# Patient Record
Sex: Female | Born: 1935 | Race: White | Hispanic: No | Marital: Married | Smoking: Never smoker
Health system: Southern US, Community
[De-identification: ages and names within clinical notes are randomized; demographics above are authoritative.]

## PROBLEM LIST (undated history)

## (undated) DIAGNOSIS — I10 Essential (primary) hypertension: Secondary | ICD-10-CM

## (undated) DIAGNOSIS — M1990 Unspecified osteoarthritis, unspecified site: Secondary | ICD-10-CM

## (undated) DIAGNOSIS — I499 Cardiac arrhythmia, unspecified: Secondary | ICD-10-CM

## (undated) DIAGNOSIS — R55 Syncope and collapse: Secondary | ICD-10-CM

## (undated) DIAGNOSIS — I447 Left bundle-branch block, unspecified: Secondary | ICD-10-CM

## (undated) DIAGNOSIS — J45909 Unspecified asthma, uncomplicated: Secondary | ICD-10-CM

---
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
## 2000-05-21 ENCOUNTER — Encounter: Payer: Self-pay | Admitting: Emergency Medicine

## 2000-05-21 ENCOUNTER — Emergency Department (HOSPITAL_COMMUNITY): Admission: EM | Admit: 2000-05-21 | Discharge: 2000-05-21 | Payer: Self-pay | Admitting: Emergency Medicine

## 2002-01-14 ENCOUNTER — Other Ambulatory Visit: Admission: RE | Admit: 2002-01-14 | Discharge: 2002-01-14 | Payer: Self-pay | Admitting: Family Medicine

## 2004-02-08 ENCOUNTER — Other Ambulatory Visit: Admission: RE | Admit: 2004-02-08 | Discharge: 2004-02-08 | Payer: Self-pay | Admitting: Family Medicine

## 2004-03-20 ENCOUNTER — Encounter: Admission: RE | Admit: 2004-03-20 | Discharge: 2004-03-20 | Payer: Self-pay | Admitting: Family Medicine

## 2007-03-05 ENCOUNTER — Ambulatory Visit: Payer: Self-pay | Admitting: Cardiology

## 2007-03-05 ENCOUNTER — Inpatient Hospital Stay (HOSPITAL_COMMUNITY): Admission: EM | Admit: 2007-03-05 | Discharge: 2007-03-07 | Payer: Self-pay | Admitting: Emergency Medicine

## 2007-03-12 ENCOUNTER — Ambulatory Visit: Payer: Self-pay | Admitting: Internal Medicine

## 2007-03-12 ENCOUNTER — Ambulatory Visit: Payer: Self-pay

## 2007-03-12 LAB — CONVERTED CEMR LAB
BUN: 10 mg/dL (ref 6–23)
CO2: 29 meq/L (ref 19–32)
Calcium: 9.7 mg/dL (ref 8.4–10.5)
Chloride: 101 meq/L (ref 96–112)
Creatinine, Ser: 0.7 mg/dL (ref 0.4–1.2)
GFR calc Af Amer: 105 mL/min
GFR calc non Af Amer: 87 mL/min
Glucose, Bld: 104 mg/dL — ABNORMAL HIGH (ref 70–99)
Potassium: 4.2 meq/L (ref 3.5–5.1)
Sodium: 139 meq/L (ref 135–145)

## 2007-04-02 ENCOUNTER — Ambulatory Visit: Payer: Self-pay | Admitting: Internal Medicine

## 2007-04-02 LAB — CONVERTED CEMR LAB
BUN: 9 mg/dL (ref 6–23)
CO2: 29 meq/L (ref 19–32)
Calcium: 9.6 mg/dL (ref 8.4–10.5)
Chloride: 106 meq/L (ref 96–112)
Creatinine, Ser: 0.6 mg/dL (ref 0.4–1.2)
GFR calc Af Amer: 126 mL/min
GFR calc non Af Amer: 104 mL/min
Glucose, Bld: 101 mg/dL — ABNORMAL HIGH (ref 70–99)
Potassium: 4.3 meq/L (ref 3.5–5.1)
Sodium: 140 meq/L (ref 135–145)

## 2007-12-08 ENCOUNTER — Ambulatory Visit: Payer: Self-pay | Admitting: Internal Medicine

## 2007-12-23 ENCOUNTER — Encounter: Payer: Self-pay | Admitting: Internal Medicine

## 2007-12-23 ENCOUNTER — Ambulatory Visit: Payer: Self-pay

## 2008-01-26 ENCOUNTER — Ambulatory Visit: Payer: Self-pay | Admitting: Internal Medicine

## 2009-02-10 IMAGING — CR DG CHEST 1V PORT
1 series · 1 of 1 positions shown · non-contrast
Comparison: none

CLINICAL DATA: 76-year-old with chest pain and cough.  
PORTABLE CHEST - 1 VIEW 03/05/07:

[AP]
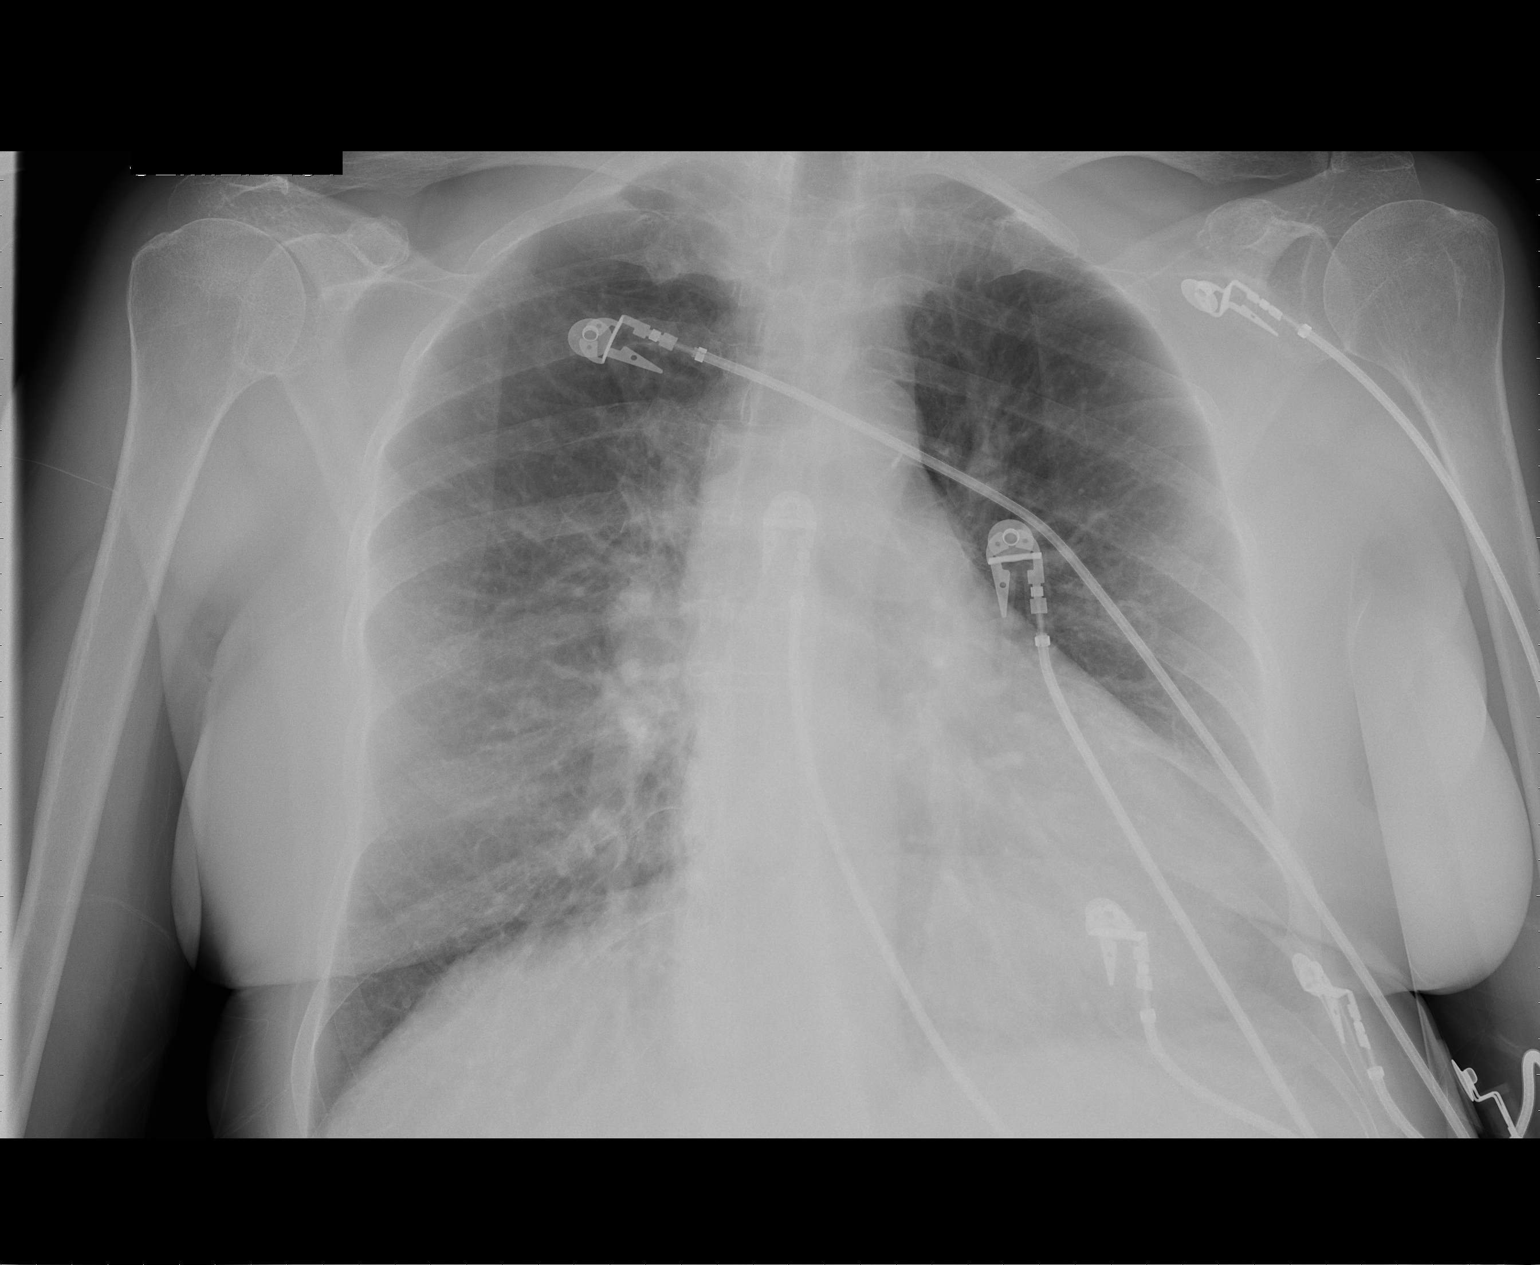

[1 of 1 positions shown; findings below may reference images not displayed]

FINDINGS: Portable view of the chest demonstrates a coarse interstitial lung pattern.  Interstitial lung pattern may represent chronic changes and there is no definite pulmonary edema.  Heart size is upper limits of normal.  The trachea is midline.  The bony structures are intact.
IMPRESSION: 1.  Cardiomegaly. 
2.  Coarse interstitial lung pattern is likely chronic in nature.  No definite pulmonary edema.

## 2010-07-11 NOTE — Letter (Signed)
December 08, 2007    [place]  [address]  [place], [place] [address]   RE:  Deanna Diaz, Deanna Diaz  MRN:  [id number]  /  DOB:  1930/07/26   Dear Michele Mcalpine,   Ms. Sheen comes in today in followup for tachycardia and cardiomyopathy.  She also has significant and unfortunately poorly-controlled blood  pressure.  Today, she has had no complaints of chest pain or shortness  of breath and no recurrent tachy palpitations.   Her medications include metoprolol 25 b.i.d. changed from 50 succinate  during the short age as well as lisinopril 20.   On examination, her blood pressure was 168/80.  Her pulse was 59.  Her  lungs were clear.  Her heart sounds were regular.  The abdomen was soft.  The extremities were without edema, and her skin was warm and dry.   IMPRESSION:  1. Supraventricular tachycardia.  2. Cardiomyopathy, question cause.  3. Hypertension - poorly controlled.  4. Modest bradycardia.   PLAN:  1. We will plan to fill today to change her lisinopril from 20 to      lisinopril/hydrochlorothiazide 20/12.5 with the hopes that you will      further up-titrate it to 40/25 as needed.  2. I will change her metoprolol to carvedilol 12.5 b.i.d.  Further up-      titration of it will also be      worth pursuing in the event that we can without aggravating her      bradycardia.  We will plan to get an outpatient echo to assess the      impact on her left ventricular systolic function.   I will see her again in 6 months' time.    Sincerely,      Duke Salvia, MD, [facility]  Electronically Signed    SCK/MedQ  DD: 12/08/2007  DT: 12/09/2007  Job #: [id number]

## 2010-07-11 NOTE — Discharge Summary (Signed)
NAME:  Deanna Diaz, Deanna Diaz NO.:  [id number]   MEDICAL RECORD NO.:  [id number]          PATIENT TYPE:  INP   LOCATION:  2028                         FACILITY:  [facility]   PHYSICIAN:  Gerrit Friends. Dietrich Pates, MD, FACCDATE OF BIRTH:  01-15-1931   DATE OF ADMISSION:  03/05/2007  DATE OF DISCHARGE:  03/07/2007                               DISCHARGE SUMMARY   PRIMARY CARDIOLOGIST:  Dr. Sherryl Manges.   PRIMARY CARE [place]:  Dr. Lianne Bushy   DISCHARGE DIAGNOSIS:  Paroxysmal supraventricular tachycardia.   SECONDARY DIAGNOSES:  1. Left bundle branch block.  2. Mild troponin elevation.  3. Cardiomyopathy, question ischemia verses nonischemic with stress      test pending.  4. Mild to moderate mitral regurgitation.   ALLERGIES:  NO KNOWN DRUG ALLERGIES.   PROCEDURES:  A 2-D echocardiogram revealing an EF of 30-35% with  hypokinesis of the anteroseptal and periapical wall.  Mild to moderate  mitral regurgitation.   HISTORY OF PRESENT ILLNESS:  This is a 77 year old female with  questionable prior history of mild stroke in 2007.  The patient had  never been formally evaluated for that.  She had no residual weakness.  She was in her usual state of health until approximately 6:30 p.m. on  March 05, 2007 when she had sudden onset of dizziness and  lightheadedness.  She was taken to the [facility] emergency room where  was found to be in SVT with rates in the 130s.  She was treated with 6  mg of intravenous adenosine with conversion to sinus rhythm with a left  bundle branch block, which is known to be old dating back to the summer  of 2008.  She was admitted for further evaluation.   HOSPITAL COURSE:  Ms. Stroh had mild elevation of troponin to a peak of  0.11 with normal CKs and MBs.  In sinus rhythm, she had no recurrent  chest discomfort.  Her BNP was also mildly elevated at 456.0.  A 2-D  echocardiogram was performed on March 06, 2007 revealing an EF of 30-  35% with  full results as outlined above.  She was evaluated by Dr.  Sherryl Manges from Electrophysiology with regards to her SVT and he  recommended continuation of beta blocker therapy in the setting of low  EF, addition of ACE inhibitor therapy.  We have arranged for outpatient  cardiac event monitoring as well as an outpatient Myoview.  She will  followup for a BMET in one week and followup with Dr. Graciela Husbands in  approximately one month.   DISCHARGE LABS:  Hemoglobin 13.6, hematocrit 39.9, WBC 10.2, platelets  270, MCV 81.3.  Sodium 135, potassium 4.2, chloride 105, CO2 26, BUN 7,  creatinine 0.48, glucose 116.  INR 1.0.  Total bilirubin 0.6, alkaline  phosphatase 103, AST 21, ALT 13, albumin 3.8.  CK 74, MB 3.9, troponin I  0.10.  Total cholesterol 181, triglycerides 64, HDL 40, LDL 128.  Calcium 8.8 and magnesium 1.8.  Urinalysis was negative.  Hemoglobin A1c  was 5.4.  D-dimer was 0.37.  BNP  was 456.0.  TSH was 0.397.   DISPOSITION:  The patient is being discharged home today in good  condition.   FOLLOW-UP PLANS AND APPOINTMENTS:  She will have a follow-up BMET on  March 14, 2007 at 9 a.m.  She will have a pharmacologic stress test on  March 12, 2007 at 9:30 a.m. at our [place] office.  We will also  arranged for her to have a cardiac event monitor.  She will followup  with Dr. Graciela Husbands on April 02, 2007 at 9:30 a.m.   DISCHARGE MEDICATIONS:  1. Toprol XL 50 mg daily.  2. Lisinopril 20 mg daily.  3. Aspirin 81 mg daily.   PENDING LAB STUDIES:  None.   DURATION OF DISCHARGE ENCOUNTER:  Forty-five minutes including physician  time.      Nicolasa Ducking, ANP      Gerrit Friends. Dietrich Pates, MD, [facility]  Electronically Signed    CB/MEDQ  D:  03/07/2007  T:  03/07/2007  Job:  [id number]   cc:   Lianne Bushy, M.D.

## 2010-07-11 NOTE — Letter (Signed)
January 26, 2008    Lianne Bushy, M.D.  [address]  [place], [place]  [address]   RE:  AZELIE, NOGUERA  MRN:  [id number]  /  DOB:  [birth year]   Dear Michele Mcalpine,   Ms. Franklyn came in today calling because she is having swelling and pain  in her lower extremities, left greater than right.  She tried to get in  with the office and has an appointment for tomorrow.   She comes in, and as you recall, she has ventricular tachycardia and  cardiomyopathy which actually had resolved by last echo and has  complaints of tenderness upon walking on her left ankle that abates as  she continues to walk.  There is overlying erythema and swelling of the  left lower foot.  In addition to that, however, she has erythematous  nodules noted on her distal thigh bilaterally, her hands bilaterally.  She has had no fevers or chills.  She has noted no significant change in  exercise tolerance.   Her medications include lisinopril HCT.   On examination, in addition to the dermatological manifestations below,  she had a blood pressure 150/74, pulse of 106, heart rate was much  faster than before.  Her lungs were clear.  Heart sounds were regular  and extremities had the edema overlying left ankle.   Electrocardiogram dated today demonstrated what I think is sinus rhythm  at 106 with prominent P-waves, but no evidence of reentry mechanism.  This could be an atrial tachycardia.  I should note that the P-wave in  lead AVL is negative, but that I guess is actually what her baseline is  too as I look back.   I am bothered by her tachycardia, but mostly in the context of these  dermatological lesions that I do not understand.  I suggested that she  could go to Urgent Care here in [place] today.  She preferred to wait  and see you tomorrow.   I have given her a prescription for Keflex for the overlying cellulitis.   Let me know if you would, and we will also need to follow-up her heart  rate to make sure that it  resolves.    Sincerely,      Duke Salvia, MD, [facility]  Electronically Signed    SCK/MedQ  DD: 01/26/2008  DT: 01/26/2008  Job #: [id number]

## 2010-07-11 NOTE — Letter (Signed)
April 02, 2007    Lianne Bushy, M.D.  [address]  [place],  [place] [address]   RE:  MARKEIA, HARKLESS  MRN:  [id number]  /  DOB:  February 15, 1931   Dear Michele Mcalpine:   Mrs. Locust comes in following a hospitalization for tachycardia that was  treated with adenosine and unfortunately, I do not know from the notes  whether it terminated with adenosine, or simply slowed with adenosine.  In any case, she was found to have a cardiomyopathy of modest proportion  with ejection fraction of 30-40% and we  elected to treated her with  beta-blockers and ACE inhibitors and she has had no clinical  recurrences.  She did undergo Myoview scanning given her left bundle  branch block and myopathy; this demonstrated no ischemia or scars.   She is tolerating her current medications of lisinopril 20 and  metoprolol 50.  She is also on aspirin.   Her blood pressure however was elevated at 168/80.  Her pulse was 70.  Lungs were clear.  HEART:  Sounds were regular.  EXTREMITIES:  Without edema.  Skin was warm and dry.   Electrocardiogram on January 7 demonstrated sinus rhythm at 101 with  intervals of 0.18/0.13/0.35.  The electrocardiogram at 3 hours before  that appeared to be sinus tachycardia fairly clearly without evidence of  flutter.   IMPRESSION:  1. Cardiomyopathy - Likely nonischemic - Mild to moderate with      ejection fraction of 40-50%.  2. Left bundle branch block.  3. Tachycardia on January 7, which appears to be sinus tachycardia.  4. Hypertension.   Mrs. Hughson is doing really very well.  We will plan to check her BMET  today Michele Mcalpine as we just started the ACE inhibitor.  Her blood pressure is  elevated today, but she thinks that is because she is nervous.  I would  target blood pressure for her about 120 if we can with her  cardiomyopathy.   We will plan to see her again in 6 months time. Let us know if there is  anything we can do to help in the interim.    Sincerely,      Duke Salvia, MD, [facility]  Electronically Signed    SCK/MedQ  DD: 04/02/2007  DT: 04/02/2007  Job #: [id number]

## 2010-07-11 NOTE — H&P (Signed)
NAME:  Deanna Diaz, Deanna Diaz NO.:  [id number]   MEDICAL RECORD NO.:  [id number]          PATIENT TYPE:  INP   LOCATION:  1843                         FACILITY:  [facility]   PHYSICIAN:  Learta Codding, MD,FACC DATE OF BIRTH:  1930/11/19   DATE OF ADMISSION:  03/05/2007  DATE OF DISCHARGE:                              HISTORY & PHYSICAL   The patient is full code.  Total visit time approximately 50 minutes.  The patient is a good historian.   CARDIOLOGIST:  None.   PRIMARY CARE PHYSICIAN:  Lianne Bushy, M.D. in [place], [place].   CHIEF COMPLAINT:  Dizziness and upset stomach.   HISTORY OF PRESENT ILLNESS:  The patient is a 77 year old female who  notes a history of mild stroke in 2007.  She denies having a CT scan or  any formal evaluation for this.  No residual weakness.  History of  dehydration in the summer of 2008 that resulted in syncope.  Found to  have left bundle branch block at the time per patient.  She presents  noting not seeing well over the last few days.  She works at [employer]  and notes her coworkers have had significant upper respiratory tract  infections and she has come down with a cough over the last few days  with white sputum.  She denies any fevers, myalgias, or dysuria,  however, the patient notes all day today she has been feeling very weak.  She notes approximately 6:30 p.m. she was about to begin to eat, she had  a profound dizzy episode and did not complete her meal.  She denies any  palpitations or chest pain.  She denies any sweats.  The patient in the  emergency room was found to have a heart rate in the 130s and possible  SVT.  She was given 6 mg of Adenosine and found to be in sinus rhythm  with a left bundle branch block afterward.  The patient denies any gross  shortness of breath.  The patient took nothing for her symptoms.   PAST MEDICAL HISTORY:  She denies ever having a catheterization  procedure, coronary artery bypass  grafting or an echo.  The patient  never had a stress test.   ALLERGIES:  No known drug allergies.   MEDICATIONS:  None.   SOCIAL HISTORY:  She has never smoked and never drank.  She is married  and lives with her husband.  She currently works at [employer].  She has  two daughters.   FAMILY HISTORY:  Positive for hypertension in her mother.  Father had  stomach cramps or GI cancer of some sort.  Siblings; one brother with  coronary artery disease and COPD, and one sister with an irregular  heartbeat.   REVIEW OF SYSTEMS:  14-point review of systems negative unless stated  above.   PHYSICAL EXAMINATION:  VITAL SIGNS:  Temperature is 97.4 with a pulse of  128 initially and now 90.  Respiratory rate 14, blood pressure 188/90,  saturations 96% on room air.  GENERAL:  She is a  thin female lying in bed in no acute distress.  HEENT:  Normocephalic and atraumatic.  Pupils equal, round, and reactive  to light.  Extraocular movements intact.  Sclerae clear.  Tympanic  membranes are clear.  Nares shows no lesions.  The oropharynx shows no  posterior pharyngeal lesions.  NECK:  Supple with no lymphadenopathy, thyromegaly, bruits, or jugular  venous distention.  HEART:  Regular rate and rhythm with no murmurs, rubs, or gallops.  Normal S1 and S2.  No S3 or S4 is noted.  LUNGS:  Clear to auscultation bilaterally.  SKIN:  No rashes or lesions.  ABDOMEN:  Soft, nontender, and nondistended.  No hepatosplenomegaly.  EXTREMITIES:  No cyanosis, clubbing, or edema.  MUSCULOSKELETAL:  Some mild arm pain and joint deformities with no signs  of effusion.  No spine tenderness or CVA tenderness.  NEUROLOGY:  She is alert and oriented x4 with Cranial nerves II-XII  grossly intact.  Strength and sensation are grossly intact.   Chest x-ray is pending.  EKG showed supraventricular tachycardia, event  rate of 129 with left bundle branch block.  QRS 129, QT corrected 555.  After 6 mg of Adenosine sinus  tachycardia of 101, left bundle branch  block with PR interval 182, QRS 132, and QT corrected 464.   LABORATORY DATA:  White count of 10.2 with a hemoglobin of 13.6,  hematocrit 39.9, platelets 270, with segs of 79%, lymphocytes 14%.  Sodium 134, potassium 4.5, chloride 99, bicarb 26, BUN 12, creatinine  0.59, glucose 173, otherwise LFTs are normal.  At 2135 CK-MB was 2.4  with troponin of less than 0.05.  Venous blood gas showed a pH of 7.4,  CO2 of 41.3, bicarb 25.9.   ASSESSMENT:  This is a patient with a history of recent upper  respiratory tract infection symptoms, cough, history of left bundle  branch block and history of possible syncope this summer, who now  presents with an SVT which broke with Adenosine.  She has an abnormal  EKG and acute coronary syndrome must be ruled out.  She notes that the  left bundle is old, but we do not have any documentation of this.  Other  concerns include possible pulmonary embolism.  Her SVT, we will get  serial cardiac enzymes and EKGs, put the patient on telemetry and check  magnesium and TSH, place her on low dose beta blocker and check a D-  dimer.  She will also be placed on aspirin.  We will check a urinalysis  as well.  For her left bundle branch block history, we will rule out  acute coronary syndrome.  She may also likely need ejection fraction  assessment of her left ventricle and a coronary blood flow assessment.  For her cough, we will give Guaifenesin.  GI prophylaxis with a proton  pump inhibitor, and DVT prophylaxis with pneumatic compression devices.      Darryl D. Prime, MD   Electronically Signed     ______________________________  Learta Codding, MD,FACC    DDP/MEDQ  D:  03/06/2007  T:  03/06/2007  Job:  [id number]

## 2010-11-16 LAB — CK TOTAL AND CKMB (NOT AT ARMC)
CK, MB: 3.9
Relative Index: INVALID
Total CK: 74

## 2010-11-16 LAB — URINALYSIS, ROUTINE W REFLEX MICROSCOPIC
Glucose, UA: 250 — AB
Protein, ur: NEGATIVE
Specific Gravity, Urine: 1.019

## 2010-11-16 LAB — I-STAT 8, (EC8 V) (CONVERTED LAB)
BUN: 14
Bicarbonate: 25.9 — ABNORMAL HIGH
Chloride: 103
Glucose, Bld: 168 — ABNORMAL HIGH
HCT: 45
pCO2, Ven: 41.3 — ABNORMAL LOW
pH, Ven: 7.406 — ABNORMAL HIGH

## 2010-11-16 LAB — TROPONIN I
Troponin I: 0.1 — ABNORMAL HIGH
Troponin I: 0.11 — ABNORMAL HIGH

## 2010-11-16 LAB — DIFFERENTIAL
Basophils Absolute: 0
Basophils Relative: 0
Eosinophils Absolute: 0.2
Eosinophils Relative: 2
Lymphs Abs: 1.4
Neutrophils Relative %: 79 — ABNORMAL HIGH

## 2010-11-16 LAB — URINE MICROSCOPIC-ADD ON

## 2010-11-16 LAB — POCT CARDIAC MARKERS
Myoglobin, poc: 63.8
Operator id: 257131

## 2010-11-16 LAB — COMPREHENSIVE METABOLIC PANEL
ALT: 13
AST: 21
CO2: 26
Calcium: 9.3
Chloride: 99
GFR calc non Af Amer: >60
Glucose, Bld: 173 — ABNORMAL HIGH
Sodium: 134 — ABNORMAL LOW
Total Bilirubin: 0.6

## 2010-11-16 LAB — BASIC METABOLIC PANEL
BUN: 7
CO2: 26
Calcium: 8.8
Chloride: 105
Creatinine, Ser: 0.48
GFR calc Af Amer: >60

## 2010-11-16 LAB — TSH: TSH: 0.397

## 2010-11-16 LAB — CBC
HCT: 39.9
Hemoglobin: 13.6
MCHC: 34.1
MCV: 81.3
Platelets: 270
RBC: 4.91
RDW: 13.4
WBC: 10.2

## 2010-11-16 LAB — APTT: aPTT: 34

## 2010-11-16 LAB — LIPID PANEL
Total CHOL/HDL Ratio: 4.5
VLDL: 13

## 2010-11-16 LAB — MAGNESIUM: Magnesium: 1.8

## 2010-11-16 LAB — PROTIME-INR: Prothrombin Time: 13

## 2012-06-12 ENCOUNTER — Encounter (HOSPITAL_COMMUNITY): Payer: Self-pay

## 2012-06-12 ENCOUNTER — Emergency Department (HOSPITAL_COMMUNITY)
Admission: EM | Admit: 2012-06-12 | Discharge: 2012-06-12 | Disposition: A | Discharge status: Home / Self Care | Payer: BC Managed Care – PPO | Attending: Emergency Medicine | Admitting: Emergency Medicine

## 2012-06-12 ENCOUNTER — Emergency Department (HOSPITAL_COMMUNITY): Payer: BC Managed Care – PPO

## 2012-06-12 DIAGNOSIS — I10 Essential (primary) hypertension: Secondary | ICD-10-CM | POA: Insufficient documentation

## 2012-06-12 DIAGNOSIS — S8990XA Unspecified injury of unspecified lower leg, initial encounter: Secondary | ICD-10-CM | POA: Insufficient documentation

## 2012-06-12 DIAGNOSIS — Z79899 Other long term (current) drug therapy: Secondary | ICD-10-CM | POA: Insufficient documentation

## 2012-06-12 DIAGNOSIS — M25469 Effusion, unspecified knee: Secondary | ICD-10-CM | POA: Insufficient documentation

## 2012-06-12 DIAGNOSIS — Y9289 Other specified places as the place of occurrence of the external cause: Secondary | ICD-10-CM | POA: Insufficient documentation

## 2012-06-12 DIAGNOSIS — Y9329 Activity, other involving ice and snow: Secondary | ICD-10-CM | POA: Insufficient documentation

## 2012-06-12 DIAGNOSIS — M25461 Effusion, right knee: Secondary | ICD-10-CM

## 2012-06-12 DIAGNOSIS — J45909 Unspecified asthma, uncomplicated: Secondary | ICD-10-CM | POA: Insufficient documentation

## 2012-06-12 DIAGNOSIS — Z8679 Personal history of other diseases of the circulatory system: Secondary | ICD-10-CM | POA: Insufficient documentation

## 2012-06-12 DIAGNOSIS — W010XXA Fall on same level from slipping, tripping and stumbling without subsequent striking against object, initial encounter: Secondary | ICD-10-CM | POA: Insufficient documentation

## 2012-06-12 HISTORY — DX: Essential (primary) hypertension: I10

## 2012-06-12 HISTORY — DX: Cardiac arrhythmia, unspecified: I49.9

## 2012-06-12 HISTORY — DX: Unspecified asthma, uncomplicated: J45.909

## 2012-06-12 MED ORDER — OXYCODONE-ACETAMINOPHEN 5-325 MG PO TABS
1.0000 | ORAL_TABLET | Freq: Once | ORAL | Status: AC
  Administered 2012-06-12: 1 via ORAL
  Filled 2012-06-12: qty 1

## 2012-06-12 NOTE — Progress Notes (Signed)
Orthopedic Tech Progress Note Patient Details:  Deanna Diaz Mar 14, 1930 [id number] Patient unable to use crutches took off charge. Patient ID: Deanna Diaz, female   DOB: 1930-06-19, 79 y.o.   MRN: [id number]   Deanna Diaz 06/12/2012, 8:31 PM

## 2012-06-12 NOTE — Progress Notes (Signed)
Orthopedic Tech Progress Note Patient Details:  Deanna Diaz [birth year] [id number]  Ortho Devices Type of Ortho Device: Crutches;Knee Immobilizer Ortho Device/Splint Location: RLE Ortho Device/Splint Interventions: Ordered;Application   Jennye Moccasin 06/12/2012, 8:14 PM

## 2012-06-12 NOTE — ED Notes (Signed)
Pt slipped in ice and fell on her back door ramp. She was seen at [facility] , had xray of right ankle and placed in ASO splint. Later today, started having right knee pain and swelling.

## 2012-06-12 NOTE — ED Notes (Signed)
Pt. Deanna Diaz this am on Ice and injured her rt. Ankle.  Went to [facility] clinic and they diagnosed her with rt. Ankle sprain and placed her in a boot. She got home and her rt. Knee began to swell. No discoloration  Unable to flex due to pain

## 2012-06-12 NOTE — ED Provider Notes (Signed)
History    This chart was scribed for non-physician practitioner working with [name], MD by Sofie Rower, ED Scribe. This patient was seen in room TR09C/TR09C and the patient's care was started at 7:20PM   CSN: [id number]  Arrival date & time 06/12/12  1719   First MD Initiated Contact with Patient 06/12/12 1920      Chief Complaint  Patient presents with  . Knee Injury    (Consider location/radiation/quality/duration/timing/severity/associated sxs/prior treatment) Patient is a 79 y.o. female presenting with injury. The history is provided by the patient and a relative. No language interpreter was used.  Injury  The incident occurred today. The incident occurred at home. The injury mechanism was a fall. Context: Walking over a patch of ice. She came to the ER via personal transport. There is an injury to the right knee. The pain is moderate. It is unlikely that a foreign body is present. She has been behaving normally.    Deanna Diaz is a 79 y.o. female , with a hx of asthma, irregular heart beat, and hypertension, who presents to the Emergency Department complaining of sudden, moderate, knee injury, located at the right knee, onset today (06/12/12).  Associated symptoms include swelling located at the right knee. The pt reports she slipped and fell on a patch of ice this morning, where she believes she may have sprained her right ankle. The pt was evaluated at the [facility] clinic where she was placed in a boot. Furthermore, after discharge, the pt informs she began to notice a progressively worsening knee pain and swelling located within her right knee. The pain and swelling within the right knee prompted the pt's concern and desire to seek medical evaluation at [facility] this evening. Modifying factors include certain movements and positions of the right knee which intensifies the knee pain.  The pt does not smoke or drink alcohol.   PCP is Dr. Nathanial Rancher.    Past Medical History   Diagnosis Date  . Asthma   . Irregular heart beat   . Hypertension     History reviewed. No pertinent past surgical history.  No family history on file.  History  Substance Use Topics  . Smoking status: Never Smoker   . Smokeless tobacco: Not on file  . Alcohol Use: No    OB History   Grav Para Term Preterm Abortions TAB SAB Ect Mult Living                  Review of Systems  Musculoskeletal: Positive for arthralgias.  All other systems reviewed and are negative.    Allergies  Review of patient's allergies indicates no known allergies.  Home Medications   Current Outpatient Rx  Name  Route  Sig  Dispense  Refill  . alendronate (FOSAMAX) 70 MG tablet   Oral   Take 70 mg by mouth every 7 (seven) days. thursdays         . escitalopram (LEXAPRO) 10 MG tablet   Oral   Take 10 mg by mouth daily.         Marland Kitchen HYDROcodone-acetaminophen (NORCO/VICODIN) 5-325 MG per tablet   Oral   Take 1 tablet by mouth every 6 (six) hours as needed for pain.         Marland Kitchen lisinopril-hydrochlorothiazide (PRINZIDE,ZESTORETIC) 20-25 MG per tablet   Oral   Take 1 tablet by mouth daily.         . metoprolol (LOPRESSOR) 50 MG tablet   Oral  Take 50 mg by mouth 2 (two) times daily.           BP 159/64  Pulse 78  Temp(Src) 100.6 F (38.1 C) (Oral)  Resp 16  SpO2 94%  Physical Exam  Nursing note and vitals reviewed. Constitutional: She is oriented to person, place, and time. She appears well-developed and well-nourished. No distress.  HENT:  Head: Normocephalic and atraumatic.  Eyes: EOM are normal.  Neck: Neck supple. No tracheal deviation present.  Cardiovascular: Normal rate, regular rhythm, normal heart sounds and intact distal pulses.  Exam reveals no gallop and no friction rub.   No murmur heard. Pulmonary/Chest: Effort normal and breath sounds normal. No respiratory distress. She has no wheezes.  Abdominal: Bowel sounds are normal. She exhibits no distension.   Musculoskeletal: Normal range of motion.       Right knee: She exhibits swelling. Tenderness found.  Neurological: She is alert and oriented to person, place, and time.  Skin: Skin is warm and dry.  Psychiatric: She has a normal mood and affect. Her behavior is normal.    ED Course  Procedures (including critical care time)  DIAGNOSTIC STUDIES: Oxygen Saturation is 94% on room air, normal by my interpretation.    COORDINATION OF CARE:  7:46 PM- Treatment plan discussed with patient. Pt agrees with treatment.      Labs Reviewed - No data to display Dg Knee Complete 4 Views Right  06/12/2012  *RADIOLOGY REPORT*  Clinical Data: Pain.  Limited range of motion after a fall.  RIGHT KNEE - COMPLETE 4+ VIEW  Comparison: None.  Findings: No fracture is identified.  The patient has a small joint effusion.  Chondrocalcinosis is noted.  Joint space narrowing appears worst in the lateral compartment.  IMPRESSION:  1.  No fracture is identified. 2.  Joint effusion. 3.  Chondrocalcinosis. 4.  Degenerative change most notable in the lateral compartment.   Original Report Authenticated By: Holley Dexter, M.D.      1. Knee effusion, right     Patient has an appointment with Tomasita Crumble on Tuesday, 06/17/12.  Patient unable to remain steady on crutches--has a walker for use at home.  MDM      I personally performed the services described in this documentation, which was scribed in my presence. The recorded information has been reviewed and is accurate.     Jimmye Norman, NP 06/12/12 2352

## 2012-06-13 NOTE — ED Provider Notes (Signed)
Medical screening examination/treatment/procedure(s) were conducted as a shared visit with non-physician practitioner(s) and myself.  I personally evaluated the patient during the encounter Pt had fallen, injuring her right ankle.  Was seen at an Urgent Care center and had negative x-rays.  Then her right knee got to hurting, so she came to [facility] ED to have that checked.  X-rays were negative.  Rx knee immobilizer, orthopedic followup.  Pt already splinted when I saw her.  [name], MD 06/13/12 1247

## 2014-03-03 DIAGNOSIS — M1711 Unilateral primary osteoarthritis, right knee: Secondary | ICD-10-CM | POA: Diagnosis not present

## 2014-05-21 IMAGING — CR DG KNEE COMPLETE 4+V*R*
4 series · 4 of 4 positions shown · non-contrast
Comparison: None.

CLINICAL DATA: Pain.  Limited range of motion after a fall.

RIGHT KNEE - COMPLETE 4+ VIEW

[t knee ap right]
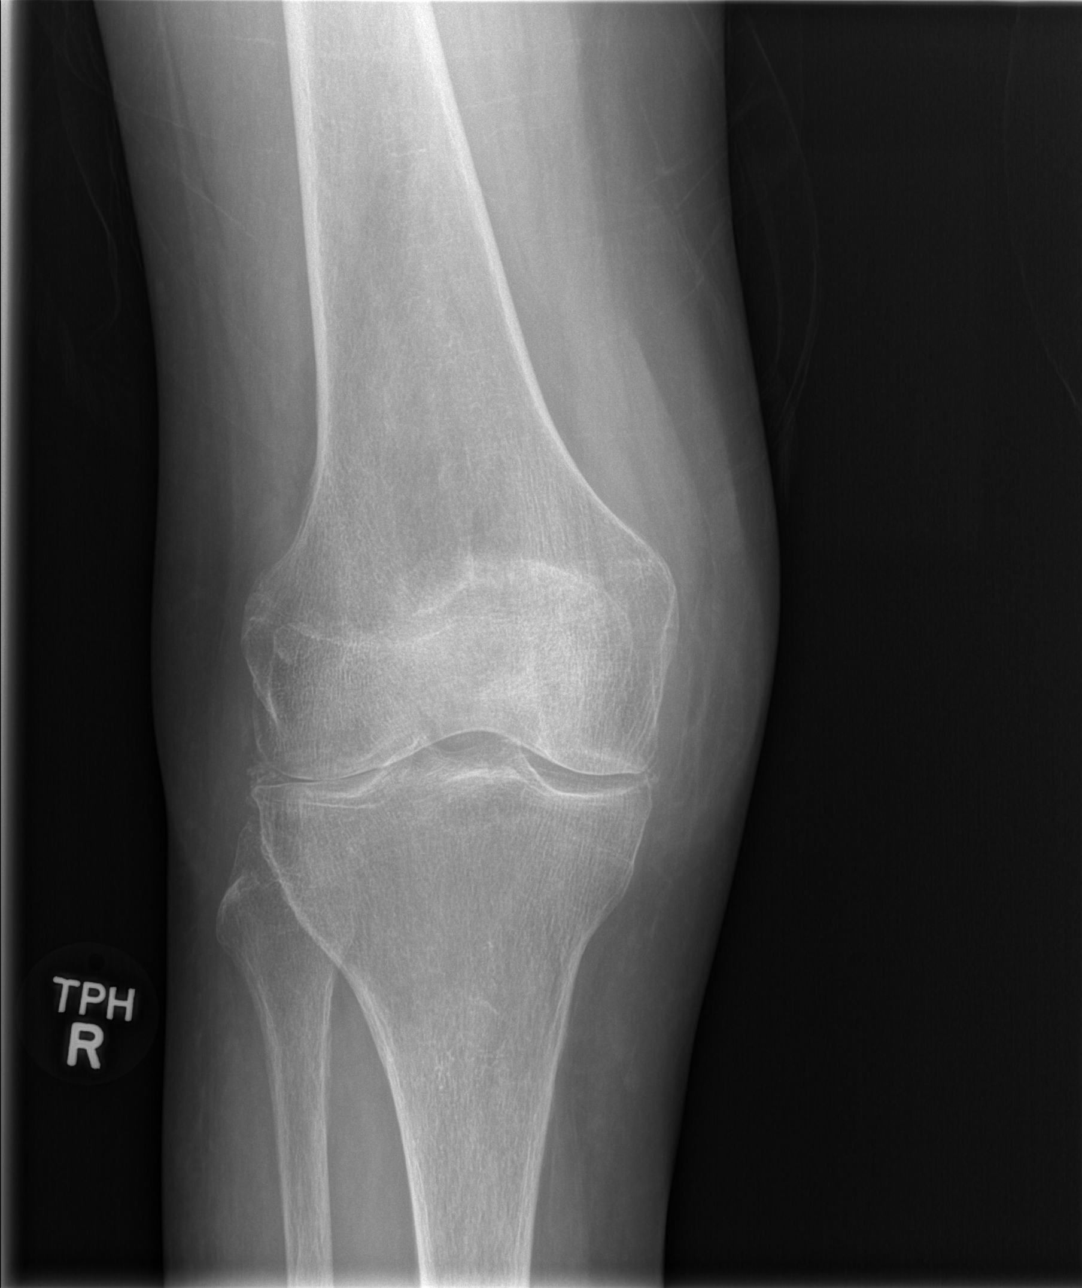

[t knee obl right (1 of 2)]
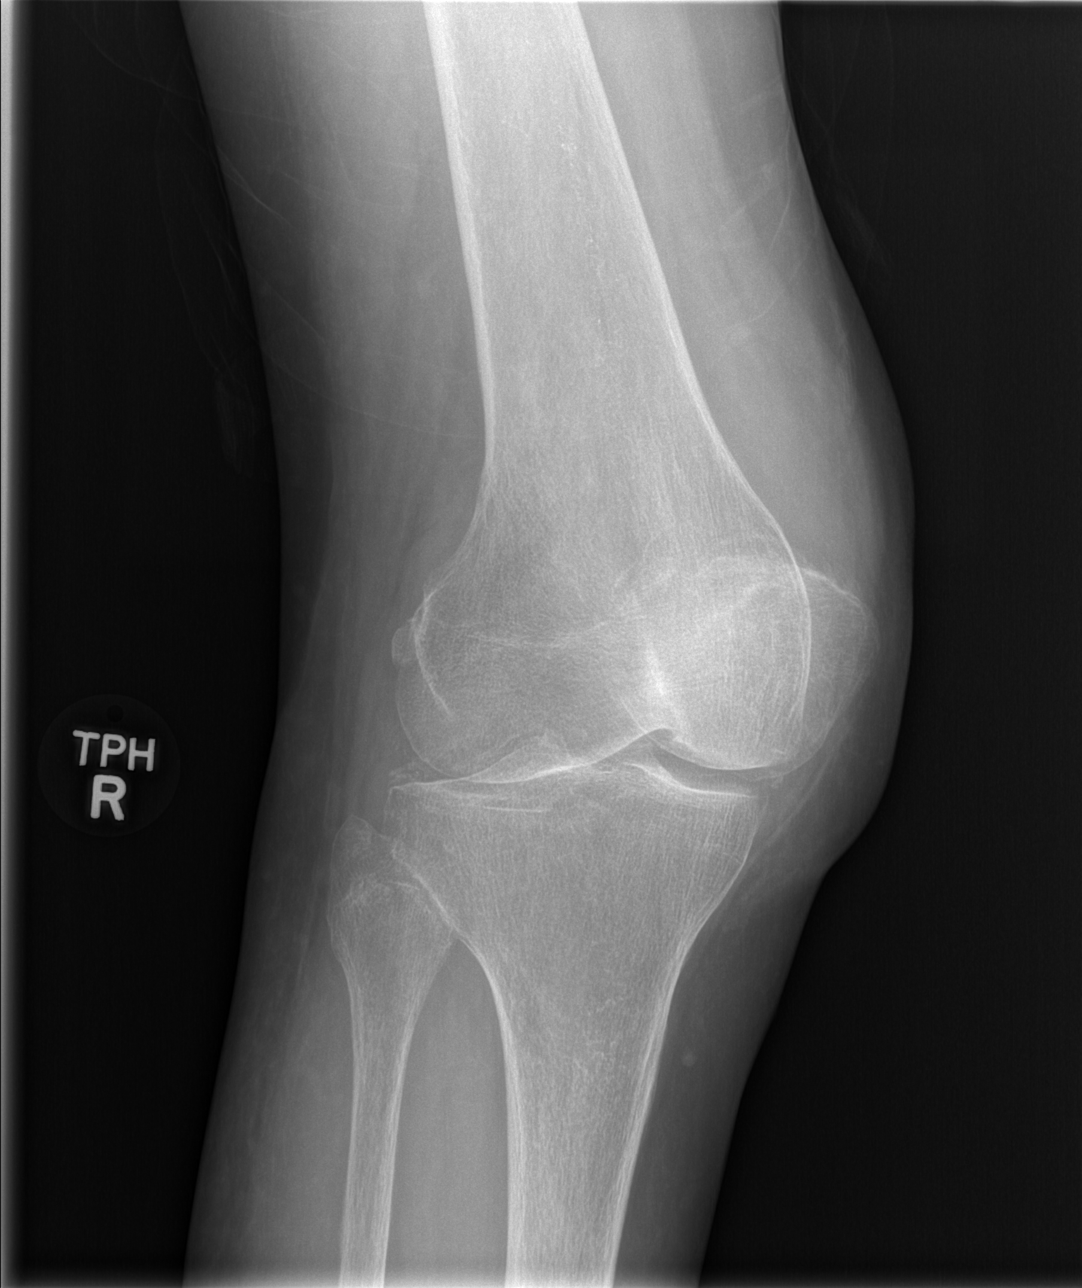

[t knee obl right (2 of 2)]
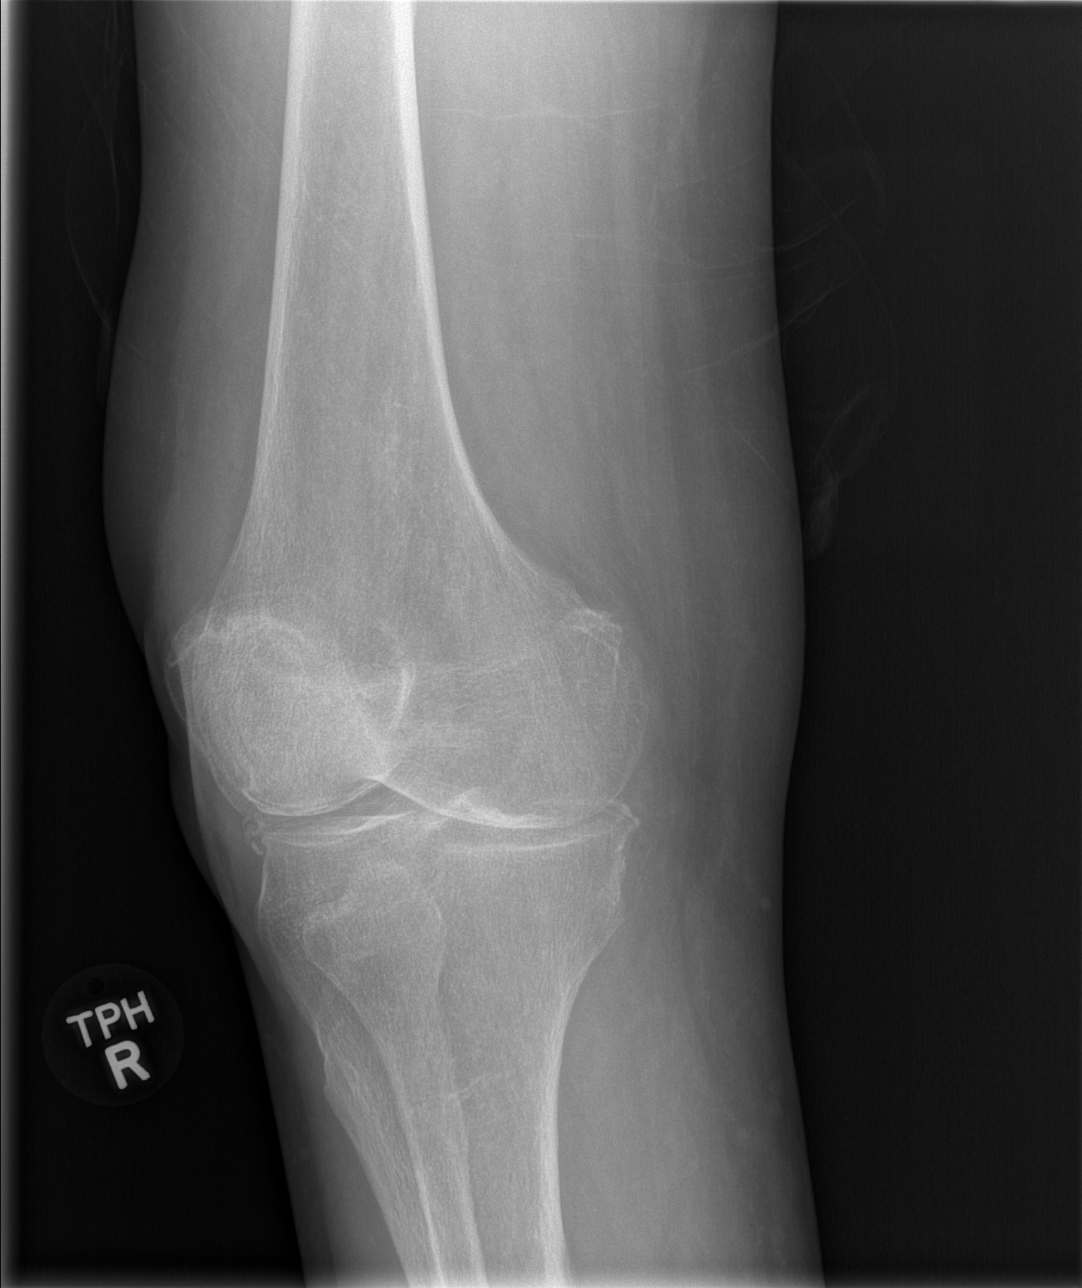

[t knee lat right]
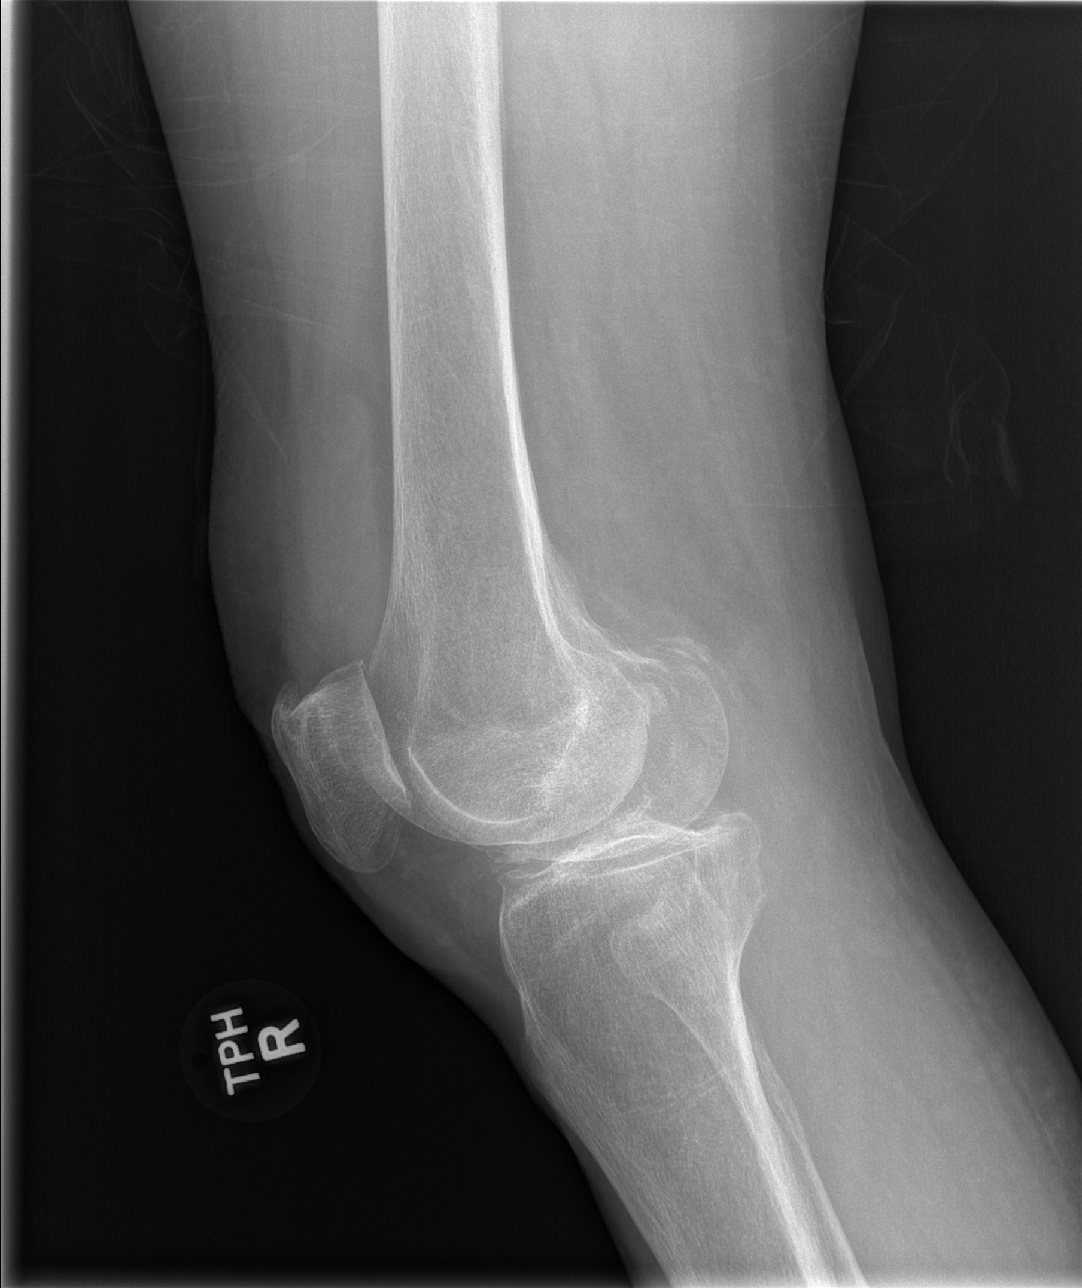

[4 of 4 positions shown; findings below may reference images not displayed]

FINDINGS: No fracture is identified.  The patient has a small joint
effusion.  Chondrocalcinosis is noted.  Joint space narrowing
appears worst in the lateral compartment.
IMPRESSION: 1.  No fracture is identified.
2.  Joint effusion.
3.  Chondrocalcinosis.
4.  Degenerative change most notable in the lateral compartment.

## 2014-06-16 DIAGNOSIS — E213 Hyperparathyroidism, unspecified: Secondary | ICD-10-CM | POA: Diagnosis not present

## 2014-06-16 DIAGNOSIS — Z1389 Encounter for screening for other disorder: Secondary | ICD-10-CM | POA: Diagnosis not present

## 2014-06-16 DIAGNOSIS — I10 Essential (primary) hypertension: Secondary | ICD-10-CM | POA: Diagnosis not present

## 2014-06-16 DIAGNOSIS — J0100 Acute maxillary sinusitis, unspecified: Secondary | ICD-10-CM | POA: Diagnosis not present

## 2014-06-16 DIAGNOSIS — N183 Chronic kidney disease, stage 3 (moderate): Secondary | ICD-10-CM | POA: Diagnosis not present

## 2014-06-16 DIAGNOSIS — Z9181 History of falling: Secondary | ICD-10-CM | POA: Diagnosis not present

## 2014-06-30 DIAGNOSIS — R05 Cough: Secondary | ICD-10-CM | POA: Diagnosis not present

## 2014-06-30 DIAGNOSIS — J0100 Acute maxillary sinusitis, unspecified: Secondary | ICD-10-CM | POA: Diagnosis not present

## 2014-06-30 DIAGNOSIS — J189 Pneumonia, unspecified organism: Secondary | ICD-10-CM | POA: Diagnosis not present

## 2014-07-06 ENCOUNTER — Inpatient Hospital Stay (HOSPITAL_COMMUNITY): Payer: BLUE CROSS/BLUE SHIELD

## 2014-07-06 ENCOUNTER — Encounter (HOSPITAL_COMMUNITY)
Admission: EM | Disposition: A | Discharge status: Skilled Nursing Facility | Payer: Self-pay | Source: Home / Self Care | Attending: Family Medicine

## 2014-07-06 ENCOUNTER — Inpatient Hospital Stay (HOSPITAL_COMMUNITY)
Admission: EM | Admit: 2014-07-06 | Discharge: 2014-07-09 | DRG: 469 | Disposition: A | Discharge status: Skilled Nursing Facility | Payer: BLUE CROSS/BLUE SHIELD | Attending: Family Medicine | Admitting: Family Medicine

## 2014-07-06 ENCOUNTER — Inpatient Hospital Stay (HOSPITAL_COMMUNITY): Payer: BLUE CROSS/BLUE SHIELD | Admitting: Anesthesiology

## 2014-07-06 ENCOUNTER — Encounter (HOSPITAL_COMMUNITY): Payer: Self-pay | Admitting: *Deleted

## 2014-07-06 ENCOUNTER — Emergency Department (HOSPITAL_COMMUNITY): Payer: BLUE CROSS/BLUE SHIELD

## 2014-07-06 ENCOUNTER — Other Ambulatory Visit (HOSPITAL_COMMUNITY): Payer: Self-pay

## 2014-07-06 DIAGNOSIS — Z681 Body mass index (BMI) 19 or less, adult: Secondary | ICD-10-CM | POA: Diagnosis not present

## 2014-07-06 DIAGNOSIS — W19XXXA Unspecified fall, initial encounter: Secondary | ICD-10-CM | POA: Diagnosis present

## 2014-07-06 DIAGNOSIS — I9581 Postprocedural hypotension: Secondary | ICD-10-CM | POA: Diagnosis not present

## 2014-07-06 DIAGNOSIS — T464X5A Adverse effect of angiotensin-converting-enzyme inhibitors, initial encounter: Secondary | ICD-10-CM | POA: Diagnosis present

## 2014-07-06 DIAGNOSIS — E43 Unspecified severe protein-calorie malnutrition: Secondary | ICD-10-CM | POA: Diagnosis not present

## 2014-07-06 DIAGNOSIS — T502X5A Adverse effect of carbonic-anhydrase inhibitors, benzothiadiazides and other diuretics, initial encounter: Secondary | ICD-10-CM | POA: Diagnosis present

## 2014-07-06 DIAGNOSIS — N179 Acute kidney failure, unspecified: Secondary | ICD-10-CM | POA: Diagnosis not present

## 2014-07-06 DIAGNOSIS — E860 Dehydration: Secondary | ICD-10-CM | POA: Diagnosis present

## 2014-07-06 DIAGNOSIS — R55 Syncope and collapse: Secondary | ICD-10-CM

## 2014-07-06 DIAGNOSIS — M810 Age-related osteoporosis without current pathological fracture: Secondary | ICD-10-CM | POA: Diagnosis present

## 2014-07-06 DIAGNOSIS — Z888 Allergy status to other drugs, medicaments and biological substances status: Secondary | ICD-10-CM | POA: Diagnosis not present

## 2014-07-06 DIAGNOSIS — I10 Essential (primary) hypertension: Secondary | ICD-10-CM | POA: Diagnosis present

## 2014-07-06 DIAGNOSIS — J45909 Unspecified asthma, uncomplicated: Secondary | ICD-10-CM | POA: Diagnosis present

## 2014-07-06 DIAGNOSIS — D649 Anemia, unspecified: Secondary | ICD-10-CM | POA: Diagnosis present

## 2014-07-06 DIAGNOSIS — I959 Hypotension, unspecified: Secondary | ICD-10-CM | POA: Diagnosis not present

## 2014-07-06 DIAGNOSIS — R404 Transient alteration of awareness: Secondary | ICD-10-CM | POA: Diagnosis not present

## 2014-07-06 DIAGNOSIS — S72002S Fracture of unspecified part of neck of left femur, sequela: Secondary | ICD-10-CM | POA: Diagnosis not present

## 2014-07-06 DIAGNOSIS — S72002A Fracture of unspecified part of neck of left femur, initial encounter for closed fracture: Principal | ICD-10-CM | POA: Diagnosis present

## 2014-07-06 DIAGNOSIS — Z9889 Other specified postprocedural states: Secondary | ICD-10-CM

## 2014-07-06 DIAGNOSIS — I370 Nonrheumatic pulmonary valve stenosis: Secondary | ICD-10-CM

## 2014-07-06 HISTORY — DX: Syncope and collapse: R55

## 2014-07-06 HISTORY — DX: Unspecified osteoarthritis, unspecified site: M19.90

## 2014-07-06 HISTORY — PX: HIP ARTHROPLASTY: SHX981

## 2014-07-06 LAB — I-STAT TROPONIN, ED: Troponin i, poc: 0 ng/mL (ref 0.00–0.08)

## 2014-07-06 LAB — TYPE AND SCREEN
ABO/RH(D): A POS
Antibody Screen: NEGATIVE

## 2014-07-06 LAB — COMPREHENSIVE METABOLIC PANEL
ALT: 16 U/L (ref 14–54)
AST: 22 U/L (ref 15–41)
Albumin: 3.3 g/dL — ABNORMAL LOW (ref 3.5–5.0)
Alkaline Phosphatase: 52 U/L (ref 38–126)
Anion gap: 10 (ref 5–15)
BILIRUBIN TOTAL: 0.8 mg/dL (ref 0.3–1.2)
BUN: 44 mg/dL — ABNORMAL HIGH (ref 6–20)
CHLORIDE: 103 mmol/L (ref 101–111)
CO2: 25 mmol/L (ref 22–32)
Calcium: 10.9 mg/dL — ABNORMAL HIGH (ref 8.9–10.3)
Creatinine, Ser: 1.39 mg/dL — ABNORMAL HIGH (ref 0.44–1.00)
GFR, EST AFRICAN AMERICAN: 40 mL/min — AB (ref >60)
GFR, EST NON AFRICAN AMERICAN: 35 mL/min — AB (ref >60)
GLUCOSE: 95 mg/dL (ref 70–99)
Potassium: 4.1 mmol/L (ref 3.5–5.1)
SODIUM: 138 mmol/L (ref 135–145)
Total Protein: 6.1 g/dL — ABNORMAL LOW (ref 6.5–8.1)

## 2014-07-06 LAB — CBC WITH DIFFERENTIAL/PLATELET
Basophils Absolute: 0 K/uL (ref 0.0–0.1)
Basophils Relative: 0 % (ref 0–1)
Eosinophils Absolute: 0.2 K/uL (ref 0.0–0.7)
Eosinophils Relative: 1 % (ref 0–5)
HCT: 37.8 % (ref 36.0–46.0)
Hemoglobin: 12.4 g/dL (ref 12.0–15.0)
Lymphocytes Relative: 20 % (ref 12–46)
Lymphs Abs: 2.9 K/uL (ref 0.7–4.0)
MCH: 28.4 pg (ref 26.0–34.0)
MCHC: 32.8 g/dL (ref 30.0–36.0)
MCV: 86.5 fL (ref 78.0–100.0)
Monocytes Absolute: 1.5 K/uL — ABNORMAL HIGH (ref 0.1–1.0)
Monocytes Relative: 11 % (ref 3–12)
Neutro Abs: 9.8 K/uL — ABNORMAL HIGH (ref 1.7–7.7)
Neutrophils Relative %: 68 % (ref 43–77)
Platelets: 262 K/uL (ref 150–400)
RBC: 4.37 MIL/uL (ref 3.87–5.11)
RDW: 13 % (ref 11.5–15.5)
WBC: 14.4 K/uL — ABNORMAL HIGH (ref 4.0–10.5)

## 2014-07-06 LAB — URINALYSIS, ROUTINE W REFLEX MICROSCOPIC
Bilirubin Urine: NEGATIVE
Glucose, UA: NEGATIVE mg/dL
Hgb urine dipstick: NEGATIVE
Ketones, ur: NEGATIVE mg/dL
Leukocytes, UA: NEGATIVE
Nitrite: NEGATIVE
Protein, ur: NEGATIVE mg/dL
Specific Gravity, Urine: 1.013 (ref 1.005–1.030)
Urobilinogen, UA: 0.2 mg/dL (ref 0.0–1.0)
pH: 6 (ref 5.0–8.0)

## 2014-07-06 LAB — SURGICAL PCR SCREEN
MRSA, PCR: NEGATIVE
Staphylococcus aureus: NEGATIVE

## 2014-07-06 LAB — ABO/RH: ABO/RH(D): A POS

## 2014-07-06 LAB — TROPONIN I

## 2014-07-06 SURGERY — HEMIARTHROPLASTY, HIP, DIRECT ANTERIOR APPROACH, FOR FRACTURE
Anesthesia: Monitor Anesthesia Care | Site: Hip | Laterality: Left

## 2014-07-06 MED ORDER — ESCITALOPRAM OXALATE 10 MG PO TABS
10.0000 mg | ORAL_TABLET | Freq: Every day | ORAL | Status: DC
  Administered 2014-07-07 – 2014-07-09 (×3): 10 mg via ORAL
  Filled 2014-07-06 (×3): qty 1

## 2014-07-06 MED ORDER — 0.9 % SODIUM CHLORIDE (POUR BTL) OPTIME
TOPICAL | Status: DC | PRN
  Administered 2014-07-06: 1000 mL

## 2014-07-06 MED ORDER — LACTATED RINGERS IV SOLN
INTRAVENOUS | Status: DC | PRN
  Administered 2014-07-06: 22:00:00 via INTRAVENOUS

## 2014-07-06 MED ORDER — SODIUM CHLORIDE 0.9 % IV SOLN
INTRAVENOUS | Status: AC
  Administered 2014-07-06: 16:00:00 via INTRAVENOUS

## 2014-07-06 MED ORDER — FENTANYL CITRATE (PF) 250 MCG/5ML IJ SOLN
INTRAMUSCULAR | Status: AC
  Filled 2014-07-06: qty 5

## 2014-07-06 MED ORDER — POLYETHYLENE GLYCOL 3350 17 G PO PACK
17.0000 g | PACK | Freq: Every day | ORAL | Status: DC | PRN
  Filled 2014-07-06: qty 1

## 2014-07-06 MED ORDER — CEFAZOLIN SODIUM-DEXTROSE 2-3 GM-% IV SOLR
2.0000 g | INTRAVENOUS | Status: DC
Start: 2014-07-07 — End: 2014-07-06

## 2014-07-06 MED ORDER — MORPHINE SULFATE 2 MG/ML IJ SOLN
0.5000 mg | INTRAMUSCULAR | Status: DC | PRN
  Administered 2014-07-06 – 2014-07-08 (×4): 0.5 mg via INTRAVENOUS
  Filled 2014-07-06 (×5): qty 1

## 2014-07-06 MED ORDER — PROPOFOL 10 MG/ML IV BOLUS
INTRAVENOUS | Status: AC
  Filled 2014-07-06: qty 20

## 2014-07-06 MED ORDER — METOCLOPRAMIDE HCL 5 MG/ML IJ SOLN
5.0000 mg | Freq: Three times a day (TID) | INTRAMUSCULAR | Status: DC | PRN
  Administered 2014-07-07: 5 mg via INTRAVENOUS
  Administered 2014-07-07: 10 mg via INTRAVENOUS
  Filled 2014-07-06 (×4): qty 2

## 2014-07-06 MED ORDER — MENTHOL 3 MG MT LOZG: 1.0000 | LOZENGE | OROMUCOSAL | Status: DC | PRN

## 2014-07-06 MED ORDER — DOCUSATE SODIUM 100 MG PO CAPS
100.0000 mg | ORAL_CAPSULE | Freq: Two times a day (BID) | ORAL | Status: DC
Start: 2014-07-06 — End: 2014-07-09
  Administered 2014-07-07 – 2014-07-08 (×4): 100 mg via ORAL
  Filled 2014-07-06 (×7): qty 1

## 2014-07-06 MED ORDER — EPHEDRINE SULFATE 50 MG/ML IJ SOLN
INTRAMUSCULAR | Status: DC | PRN
  Administered 2014-07-06: 10 mg via INTRAVENOUS

## 2014-07-06 MED ORDER — ONDANSETRON HCL 4 MG/2ML IJ SOLN
INTRAMUSCULAR | Status: AC
  Filled 2014-07-06: qty 2

## 2014-07-06 MED ORDER — ONDANSETRON HCL 4 MG/2ML IJ SOLN
4.0000 mg | Freq: Four times a day (QID) | INTRAMUSCULAR | Status: DC | PRN
  Administered 2014-07-06 – 2014-07-08 (×3): 4 mg via INTRAVENOUS
  Filled 2014-07-06 (×3): qty 2

## 2014-07-06 MED ORDER — DEXTROSE-NACL 5-0.45 % IV SOLN
100.0000 mL/h | INTRAVENOUS | Status: DC
Start: 2014-07-06 — End: 2014-07-06

## 2014-07-06 MED ORDER — CEFAZOLIN SODIUM-DEXTROSE 2-3 GM-% IV SOLR
2.0000 g | INTRAVENOUS | Status: AC
  Administered 2014-07-06: 2 g via INTRAVENOUS
  Filled 2014-07-06: qty 50

## 2014-07-06 MED ORDER — FENTANYL CITRATE (PF) 100 MCG/2ML IJ SOLN
25.0000 ug | INTRAMUSCULAR | Status: DC | PRN
  Administered 2014-07-06: 50 ug via INTRAVENOUS
  Administered 2014-07-06 (×2): 25 ug via INTRAVENOUS

## 2014-07-06 MED ORDER — ONDANSETRON HCL 4 MG/2ML IJ SOLN
INTRAMUSCULAR | Status: DC | PRN
  Administered 2014-07-06: 4 mg via INTRAVENOUS

## 2014-07-06 MED ORDER — SODIUM CHLORIDE 0.9 % IV BOLUS (SEPSIS)
500.0000 mL | Freq: Once | INTRAVENOUS | Status: AC
  Administered 2014-07-06: 500 mL via INTRAVENOUS

## 2014-07-06 MED ORDER — METOCLOPRAMIDE HCL 5 MG PO TABS
5.0000 mg | ORAL_TABLET | Freq: Three times a day (TID) | ORAL | Status: DC | PRN
  Filled 2014-07-06 (×2): qty 2

## 2014-07-06 MED ORDER — PHENYLEPHRINE 40 MCG/ML (10ML) SYRINGE FOR IV PUSH (FOR BLOOD PRESSURE SUPPORT)
PREFILLED_SYRINGE | INTRAVENOUS | Status: AC
  Filled 2014-07-06: qty 10

## 2014-07-06 MED ORDER — HYDROCODONE-ACETAMINOPHEN 5-325 MG PO TABS: 1.0000 | ORAL_TABLET | ORAL | Status: DC | PRN

## 2014-07-06 MED ORDER — HYDROCODONE-ACETAMINOPHEN 5-325 MG PO TABS
1.0000 | ORAL_TABLET | Freq: Four times a day (QID) | ORAL | Status: DC | PRN
  Administered 2014-07-07: 2 via ORAL
  Filled 2014-07-06: qty 2

## 2014-07-06 MED ORDER — PROPOFOL 10 MG/ML IV BOLUS
INTRAVENOUS | Status: DC | PRN
  Administered 2014-07-06: 100 mg via INTRAVENOUS

## 2014-07-06 MED ORDER — ONDANSETRON HCL 4 MG/2ML IJ SOLN
4.0000 mg | Freq: Once | INTRAMUSCULAR | Status: AC
  Administered 2014-07-06: 4 mg via INTRAVENOUS
  Filled 2014-07-06: qty 2

## 2014-07-06 MED ORDER — ASPIRIN EC 325 MG PO TBEC: 325.0000 mg | DELAYED_RELEASE_TABLET | Freq: Every day | ORAL | Status: DC

## 2014-07-06 MED ORDER — ACETAMINOPHEN 325 MG PO TABS: 650.0000 mg | ORAL_TABLET | Freq: Four times a day (QID) | ORAL | Status: DC | PRN

## 2014-07-06 MED ORDER — PROPOFOL INFUSION 10 MG/ML OPTIME
INTRAVENOUS | Status: DC | PRN
  Administered 2014-07-06: 75 ug/kg/min via INTRAVENOUS

## 2014-07-06 MED ORDER — FENTANYL CITRATE (PF) 100 MCG/2ML IJ SOLN
INTRAMUSCULAR | Status: AC
  Filled 2014-07-06: qty 2

## 2014-07-06 MED ORDER — HYDROMORPHONE HCL 1 MG/ML IJ SOLN
0.5000 mg | Freq: Once | INTRAMUSCULAR | Status: AC
  Administered 2014-07-06: 0.5 mg via INTRAVENOUS
  Filled 2014-07-06: qty 1

## 2014-07-06 MED ORDER — ONDANSETRON HCL 4 MG/2ML IJ SOLN
4.0000 mg | Freq: Once | INTRAMUSCULAR | Status: AC | PRN
  Administered 2014-07-06: 4 mg via INTRAVENOUS

## 2014-07-06 MED ORDER — PHENOL 1.4 % MT LIQD
1.0000 | OROMUCOSAL | Status: DC | PRN
  Administered 2014-07-08: 1 via OROMUCOSAL
  Filled 2014-07-06: qty 177

## 2014-07-06 MED ORDER — ASPIRIN EC 325 MG PO TBEC
325.0000 mg | DELAYED_RELEASE_TABLET | Freq: Every day | ORAL | Status: DC
  Administered 2014-07-07: 325 mg via ORAL
  Filled 2014-07-06 (×3): qty 1

## 2014-07-06 MED ORDER — ACETAMINOPHEN 500 MG PO TABS
1000.0000 mg | ORAL_TABLET | Freq: Once | ORAL | Status: AC
  Administered 2014-07-06: 1000 mg via ORAL
  Filled 2014-07-06: qty 2

## 2014-07-06 MED ORDER — DOCUSATE SODIUM 100 MG PO CAPS: 100.0000 mg | ORAL_CAPSULE | Freq: Two times a day (BID) | ORAL | Status: DC

## 2014-07-06 MED ORDER — FENTANYL CITRATE (PF) 100 MCG/2ML IJ SOLN
INTRAMUSCULAR | Status: DC | PRN
  Administered 2014-07-06: 100 ug via INTRAVENOUS

## 2014-07-06 MED ORDER — HYPROMELLOSE (GONIOSCOPIC) 2.5 % OP SOLN: 1.0000 [drp] | OPHTHALMIC | Status: DC | PRN

## 2014-07-06 MED ORDER — METOPROLOL TARTRATE 50 MG PO TABS
50.0000 mg | ORAL_TABLET | Freq: Two times a day (BID) | ORAL | Status: DC
  Filled 2014-07-06 (×3): qty 1

## 2014-07-06 MED ORDER — PHENYLEPHRINE HCL 10 MG/ML IJ SOLN
INTRAMUSCULAR | Status: DC | PRN
  Administered 2014-07-06 (×5): 80 ug via INTRAVENOUS

## 2014-07-06 MED ORDER — CEFAZOLIN SODIUM-DEXTROSE 2-3 GM-% IV SOLR
2.0000 g | Freq: Four times a day (QID) | INTRAVENOUS | Status: AC
  Administered 2014-07-07 (×2): 2 g via INTRAVENOUS
  Filled 2014-07-06 (×2): qty 50

## 2014-07-06 SURGICAL SUPPLY — 51 items
BIT DRILL 7/64X5 DISP (BIT) ×3 IMPLANT
BLADE SAGITTAL 25.0X1.27X90 (BLADE) ×2 IMPLANT
BLADE SAGITTAL 25.0X1.27X90MM (BLADE) ×1
CAPT HIP HEMI 2 ×3 IMPLANT
CLOSURE STERI-STRIP 1/2X4 (GAUZE/BANDAGES/DRESSINGS) ×1
CLSR STERI-STRIP ANTIMIC 1/2X4 (GAUZE/BANDAGES/DRESSINGS) ×2 IMPLANT
COVER SURGICAL LIGHT HANDLE (MISCELLANEOUS) ×3 IMPLANT
DRAPE IMP U-DRAPE 54X76 (DRAPES) ×3 IMPLANT
DRAPE ORTHO SPLIT 77X108 STRL (DRAPES) ×4
DRAPE SURG ORHT 6 SPLT 77X108 (DRAPES) ×2 IMPLANT
DRAPE U-SHAPE 47X51 STRL (DRAPES) ×3 IMPLANT
DRSG MEPILEX BORDER 4X8 (GAUZE/BANDAGES/DRESSINGS) ×3 IMPLANT
DURAPREP 26ML APPLICATOR (WOUND CARE) ×3 IMPLANT
ELECT CAUTERY BLADE 6.4 (BLADE) ×3 IMPLANT
ELECT REM PT RETURN 9FT ADLT (ELECTROSURGICAL) ×3
ELECTRODE REM PT RTRN 9FT ADLT (ELECTROSURGICAL) ×1 IMPLANT
FACESHIELD WRAPAROUND (MASK) ×3 IMPLANT
GLOVE BIO SURGEON STRL SZ7 (GLOVE) ×6 IMPLANT
GLOVE BIO SURGEON STRL SZ7.5 (GLOVE) ×3 IMPLANT
GLOVE BIOGEL PI IND STRL 7.0 (GLOVE) ×1 IMPLANT
GLOVE BIOGEL PI IND STRL 7.5 (GLOVE) ×1 IMPLANT
GLOVE BIOGEL PI IND STRL 8 (GLOVE) ×2 IMPLANT
GLOVE BIOGEL PI INDICATOR 7.0 (GLOVE) ×2
GLOVE BIOGEL PI INDICATOR 7.5 (GLOVE) ×2
GLOVE BIOGEL PI INDICATOR 8 (GLOVE) ×4
GLOVE BIOGEL PI ORTHO PRO SZ8 (GLOVE) ×2
GLOVE PI ORTHO PRO STRL SZ8 (GLOVE) ×1 IMPLANT
GLOVE SURG ORTHO 8.0 STRL STRW (GLOVE) ×3 IMPLANT
GOWN STRL REUS W/ TWL LRG LVL3 (GOWN DISPOSABLE) ×1 IMPLANT
GOWN STRL REUS W/ TWL XL LVL3 (GOWN DISPOSABLE) ×1 IMPLANT
GOWN STRL REUS W/TWL LRG LVL3 (GOWN DISPOSABLE) ×2
GOWN STRL REUS W/TWL XL LVL3 (GOWN DISPOSABLE) ×2
HIP CAPITATED HEMI 2 ×1 IMPLANT
KIT BASIN OR (CUSTOM PROCEDURE TRAY) ×3 IMPLANT
KIT ROOM TURNOVER OR (KITS) ×3 IMPLANT
MANIFOLD NEPTUNE II (INSTRUMENTS) ×3 IMPLANT
NS IRRIG 1000ML POUR BTL (IV SOLUTION) ×3 IMPLANT
PACK TOTAL JOINT (CUSTOM PROCEDURE TRAY) ×3 IMPLANT
PACK UNIVERSAL I (CUSTOM PROCEDURE TRAY) ×3 IMPLANT
PAD ARMBOARD 7.5X6 YLW CONV (MISCELLANEOUS) ×6 IMPLANT
PILLOW ABDUCTION HIP (SOFTGOODS) ×3 IMPLANT
RETRIEVER SUT HEWSON (MISCELLANEOUS) ×3 IMPLANT
SUT FIBERWIRE #2 38 REV NDL BL (SUTURE) ×6
SUT MNCRL AB 4-0 PS2 18 (SUTURE) ×3 IMPLANT
SUT MON AB 2-0 CT1 36 (SUTURE) ×3 IMPLANT
SUT VIC AB 1 CT1 27 (SUTURE) ×2
SUT VIC AB 1 CT1 27XBRD ANBCTR (SUTURE) ×1 IMPLANT
SUTURE FIBERWR#2 38 REV NDL BL (SUTURE) ×2 IMPLANT
TOWEL OR 17X24 6PK STRL BLUE (TOWEL DISPOSABLE) ×3 IMPLANT
TOWEL OR 17X26 10 PK STRL BLUE (TOWEL DISPOSABLE) ×3 IMPLANT
WATER STERILE IRR 1000ML POUR (IV SOLUTION) ×6 IMPLANT

## 2014-07-06 NOTE — ED Notes (Signed)
Pt reports feeling dizzy while bending over at work, pt fell onto tile floor, witnessed a few sec syncopal episode, pt has shortening & external rotation to L hip, pt A&O x4, follows commands, speaks in complete sentences, denies hitting head, +PS

## 2014-07-06 NOTE — Anesthesia Procedure Notes (Addendum)
Spinal Patient location during procedure: OR Start time: 07/06/2014 8:40 PM End time: 07/06/2014 8:45 PM Staffing Performed by: anesthesiologist  Preanesthetic Checklist Completed: patient identified, site marked, surgical consent, pre-op evaluation, timeout performed, IV checked, risks and benefits discussed and monitors and equipment checked Spinal Block Patient position: left lateral decubitus Prep: Betadine Patient monitoring: heart rate, cardiac monitor, continuous pulse ox and blood pressure Approach: left paramedian Location: L3-4 Injection technique: single-shot Needle Needle type: Pencil-Tip  Needle gauge: 22 G Needle length: 9 cm Assessment Sensory level: T10 Additional Notes Clear CSF  10 mg 0.75% bupivacaine injected easily  Procedure Name: LMA Insertion Date/Time: 07/06/2014 9:13 PM Performed by: Jerilee HohMUMM, Leighla Chestnutt N Pre-anesthesia Checklist: Patient identified, Emergency Drugs available, Patient being monitored and Suction available Patient Re-evaluated:Patient Re-evaluated prior to inductionOxygen Delivery Method: Circle system utilized Preoxygenation: Pre-oxygenation with 100% oxygen Intubation Type: IV induction LMA: LMA inserted LMA Size: 4.0 Tube type: Oral Number of attempts: 1 Placement Confirmation: positive ETCO2 and breath sounds checked- equal and bilateral Tube secured with: Tape Dental Injury: Teeth and Oropharynx as per pre-operative assessment

## 2014-07-06 NOTE — ED Notes (Signed)
Xray contacted re: hip results, results reveal & L hip fracture, Estell HarpinZammit, MD informed

## 2014-07-06 NOTE — Progress Notes (Signed)
*  PRELIMINARY RESULTS* Echocardiogram 2D Echocardiogram has been performed.  Jeryl Columbialliott, Sayyid Harewood 07/06/2014, 2:36 PM

## 2014-07-06 NOTE — Transfer of Care (Signed)
Immediate Anesthesia Transfer of Care Note  Patient: Deanna MorgansLaura H Rybicki  Procedure(s) Performed: Procedure(s): ARTHROPLASTY BIPOLAR HIP (HEMIARTHROPLASTY) (Left)  Patient Location: PACU  Anesthesia Type:General and Spinal  Level of Consciousness: awake, alert , oriented and patient cooperative  Airway & Oxygen Therapy: Patient Spontanous Breathing and Patient connected to nasal cannula oxygen  Post-op Assessment: Report given to RN and Post -op Vital signs reviewed and stable  Post vital signs: Reviewed and stable  Last Vitals:  Filed Vitals:   07/06/14 1705  BP: 136/46  Pulse: 74  Temp: 36.8 C  Resp: 16    Complications: No apparent anesthesia complications

## 2014-07-06 NOTE — ED Notes (Signed)
Daughter, Cynda AcresSylvia Gesture at bedside, tan pants cut & given to family

## 2014-07-06 NOTE — Anesthesia Preprocedure Evaluation (Signed)
Anesthesia Evaluation  Patient identified by MRN, date of birth, ID band Patient awake    Reviewed: Allergy & Precautions, NPO status , Patient's Chart, lab work & pertinent test results  Airway Mallampati: II  TM Distance: >3 FB Neck ROM: Full    Dental  (+) Teeth Intact, Dental Advisory Given   Pulmonary  breath sounds clear to auscultation        Cardiovascular hypertension, Rhythm:Regular Rate:Normal     Neuro/Psych    GI/Hepatic   Endo/Other    Renal/GU      Musculoskeletal   Abdominal   Peds  Hematology   Anesthesia Other Findings   Reproductive/Obstetrics                             Anesthesia Physical Anesthesia Plan  ASA: III  Anesthesia Plan: MAC and Spinal   Post-op Pain Management:    Induction: Intravenous  Airway Management Planned: Natural Airway and Nasal Cannula  Additional Equipment:   Intra-op Plan:   Post-operative Plan:   Informed Consent: I have reviewed the patients History and Physical, chart, labs and discussed the procedure including the risks, benefits and alternatives for the proposed anesthesia with the patient or authorized representative who has indicated his/her understanding and acceptance.   Dental advisory given  Plan Discussed with: CRNA and Anesthesiologist  Anesthesia Plan Comments: (L. Femoral neck fracture H/O SVT now in SR Syncopal episode suspect secondary to dehydration, troponin  Nl LV function by echo, no AS H/O bronchitis Htn Nausea with pain meds  Plan SAB  Kipp Broodavid Burnett Spray)        Anesthesia Quick Evaluation

## 2014-07-06 NOTE — Progress Notes (Signed)
Orthopedic Tech Progress Note Patient Details:  Deanna MorgansLaura H Diaz 1930-03-18 [id number]  Patient ID: Deanna MorgansLaura H Diaz, female   DOB: 1930-03-18, 81 y.o.   MRN: [id number] Pt unable to use trapeze bar patient helper; RN notified  Nikki DomCrawford, Gabriell Daigneault 07/06/2014, 1:10 PM

## 2014-07-06 NOTE — Op Note (Signed)
07/06/2014  9:46 PM  PATIENT:  Deanna Diaz   MRN: [id number]  PRE-OPERATIVE DIAGNOSIS:  Left Hip Fx  POST-OPERATIVE DIAGNOSIS:  Left Hip Fx  PROCEDURE:  Procedure(s): ARTHROPLASTY BIPOLAR HIP (HEMIARTHROPLASTY)  PREOPERATIVE INDICATIONS:  Deanna Diaz is an 81 y.o. female who was admitted 07/06/2014 with a diagnosis of Syncope and elected for surgical management.  The risks benefits and alternatives were discussed with the patient including but not limited to the risks of nonoperative treatment, versus surgical intervention including infection, bleeding, nerve injury, periprosthetic fracture, the need for revision surgery, dislocation, leg length discrepancy, blood clots, cardiopulmonary complications, morbidity, mortality, among others, and they were willing to proceed.  Predicted outcome is good, although there will be at least a six to nine month expected recovery.   OPERATIVE REPORT     SURGEON:  Edmonia Lynch, MD    ASSISTANT:  Joya Gaskins, OPA-C, present and scrubbed throughout the case, critical for completion in a timely fashion, and for retraction, instrumentation, and closure.     ANESTHESIA:  General    COMPLICATIONS:  None.      COMPONENTS:  [employer]: Femoral stem: 4, Neck:-4   PROCEDURE IN DETAIL: The patient was met in the holding area and identified.  The appropriate hip  was marked at the operative site. The patient was then transported to the OR and  placed under general anesthesia.  At that point, the patient was  placed in the lateral decubitus position with the operative side up and  secured to the operating room table and all bony prominences padded.     The operative lower extremity was prepped from the iliac crest to the toes.  Sterile draping was performed.  Time out was performed prior to incision.      A routine posterolateral approach was utilized via sharp dissection  carried down to the subcutaneous tissue.  Gross bleeders were Bovie   coagulated.  The iliotibial band was identified and incised  along the length of the skin incision.  Self-retaining retractors were  inserted.  With the hip internally rotated, the short external rotators  were identified. The piriformis was tagged with FiberWire, and the hip capsule released in a T-type fashion.  The femoral neck was exposed, and I resected the femoral neck using the appropriate jig. This was performed at approximately a thumb's breadth above the lesser trochanter.    I then exposed the deep acetabulum, cleared out any tissue including the ligamentum teres, and included the hip capsule in the FiberWire used above and below the T.    I then prepared the proximal femur using the cookie-cutter, the lateralizing reamer, and then sequentially broached.  A trial utilized, and I reduced the hip and it was found to have excellent stability with functional range of motion. The trial components were then removed.   The canal and acetabulum were thoroughly irrigated  I inserted the pressfit stem and placed the head and neck collar. The hip was reduced with appropriate force and was stable through a range of motion.   I then used a 2 mm drill bits to pass the FiberWire suture from the capsule and puriform is through the greater trochanter, and secured this. Excellent posterior capsular repair was achieved. I also closed the T in the capsule.  I then irrigated the hip copiously again with pulse lavage, and repaired the fascia with Vicryl, followed by Vicryl for the subcutaneous tissue, Monocryl for the skin, Steri-Strips and sterile gauze. The wounds  were injected. The patient was then awakened and returned to PACU in stable and satisfactory condition. There were no complications.  POST-OP PLAN: Weight bearing as tolerated. DVT px will consist of SCD's and ASA 325  Edmonia Lynch, MD Orthopedic Surgeon [phone]   07/06/2014 9:46 PM   This note was generated using a template and  dragon dictation system. In light of that, I have reviewed the note and all aspects of it are applicable to this case. Any dictation errors are due to the computerized dictation system.

## 2014-07-06 NOTE — ED Notes (Signed)
Foley Cath inserted by EMT Noreene LarssonJill. Sterile technique supervised by EMT Apolinar JunesBrandon.

## 2014-07-06 NOTE — Progress Notes (Signed)
Report called to anesthesia, Pt transported to OR via bed, CMT notified.

## 2014-07-06 NOTE — H&P (Addendum)
[facility] History and Physical  ROSALENA MCCORRY HQP:[id number] DOB: October 07, 1930 DOA: 07/06/2014   PCP: Leonides Sake, MD  Specialists: None  Chief Complaint: Passed out  HPI: Deanna Diaz is a 81 y.o. female with a past medical history of hypertension, osteoporosis who is fairly active and works as a Scientist, water quality in [employer]. She tells me that she last week she dealt with acute bronchitis. She was prescribed antibiotics and steroids by her primary care physician. She is recovering from that illness and then today was her first day back to work. She was working and then bent over to pick up something and felt dizzy and passed out. Denies any chest pain, shortness of breath or palpitations either before or after the episode. No nausea, vomiting at that time. No heartburns. No headaches, lightheadedness since then. She tells me that she is fairly active at home. Does most of her housework herself. Denies any shortness of breath or chest pain with these activities. Denies any history of heart disease.  Home Medications: Prior to Admission medications   Medication Sig Start Date End Date Taking? Authorizing Provider  acetaminophen (TYLENOL) 500 MG tablet Take 500 mg by mouth every 6 (six) hours as needed for mild pain or moderate pain.   Yes Historical Provider, MD  alendronate (FOSAMAX) 70 MG tablet Take 70 mg by mouth every 7 (seven) days. thursdays   Yes Historical Provider, MD  escitalopram (LEXAPRO) 10 MG tablet Take 10 mg by mouth daily.   Yes Historical Provider, MD  hydroxypropyl methylcellulose / hypromellose (ISOPTO TEARS / GONIOVISC) 2.5 % ophthalmic solution Place 1 drop into both eyes as needed for dry eyes.   Yes Historical Provider, MD  lisinopril-hydrochlorothiazide (PRINZIDE,ZESTORETIC) 20-25 MG per tablet Take 1 tablet by mouth daily.   Yes Historical Provider, MD  metoprolol (LOPRESSOR) 50 MG tablet Take 50 mg by mouth 2 (two) times daily.   Yes Historical Provider, MD     Allergies:  Allergies  Allergen Reactions  . Dilaudid [Hydromorphone] Nausea And Vomiting  . Hydrocodone Nausea And Vomiting  . Oxycodone Nausea And Vomiting    Past Medical History: Past Medical History  Diagnosis Date  . Asthma   . Irregular heart beat   . Hypertension     History reviewed. No pertinent past surgical history.  Social History: She lives by herself in [place]. No history of smoking or alcohol use. Usually independent with daily activities.  Family History: She mentions a history of hypertension in the family but the is not specified any further.  Review of Systems - History obtained from the patient General ROS: negative Psychological ROS: negative Ophthalmic ROS: negative ENT ROS: negative Allergy and Immunology ROS: negative Hematological and Lymphatic ROS: negative Endocrine ROS: negative Respiratory ROS: no cough, shortness of breath, or wheezing Cardiovascular ROS: no chest pain or dyspnea on exertion Gastrointestinal ROS: no abdominal pain, change in bowel habits, or black or bloody stools Genito-Urinary ROS: no dysuria, trouble voiding, or hematuria Musculoskeletal ROS: negative Neurological ROS: no TIA or stroke symptoms Dermatological ROS: negative  Physical Examination  Filed Vitals:   07/06/14 0936 07/06/14 1030 07/06/14 1100 07/06/14 1130  BP: 161/70 145/63 162/55 157/57  Pulse: 66 65 65 69  Temp:      TempSrc:      Resp: $Remo'18 16 17 16  'DTsCN$ Height:      Weight:      SpO2: 100% 98% 97% 97%    BP 157/57 mmHg  Pulse 69  Temp(Src) 97.5 F (36.4 C) (Oral)  Resp 16  Ht $R'5\' 3"'Fv$  (1.6 m)  Wt 57.607 kg (127 lb)  BMI 22.50 kg/m2  SpO2 97%  General appearance: alert, cooperative, appears stated age and no distress Head: Normocephalic, without obvious abnormality, atraumatic Eyes: conjunctivae/corneas clear. PERRL, EOM's intact. Throat: Slightly dry mucous membranes Neck: no adenopathy, no carotid bruit, no JVD, supple,  symmetrical, trachea midline and thyroid not enlarged, symmetric, no tenderness/mass/nodules Resp: clear to auscultation bilaterally Cardio: regular rate and rhythm, S1, S2 normal, no murmur, click, rub or gallop GI: soft, non-tender; bowel sounds normal; no masses,  no organomegaly Extremities: LLE is externally rotated Pulses: 2+ and symmetric Skin: Skin color, texture, turgor normal. No rashes or lesions Lymph nodes: Cervical, supraclavicular, and axillary nodes normal. Neurologic: No focal deficits.  Laboratory Data: Results for orders placed or performed during the hospital encounter of 07/06/14 (from the past 48 hour(s))  CBC with Differential/Platelet     Status: Abnormal   Collection Time: 07/06/14  7:55 AM  Result Value Ref Range   WBC 14.4 (H) 4.0 - 10.5 K/uL   RBC 4.37 3.87 - 5.11 MIL/uL   Hemoglobin 12.4 12.0 - 15.0 g/dL   HCT 37.8 36.0 - 46.0 %   MCV 86.5 78.0 - 100.0 fL   MCH 28.4 26.0 - 34.0 pg   MCHC 32.8 30.0 - 36.0 g/dL   RDW 13.0 11.5 - 15.5 %   Platelets 262 150 - 400 K/uL   Neutrophils Relative % 68 43 - 77 %   Neutro Abs 9.8 (H) 1.7 - 7.7 K/uL   Lymphocytes Relative 20 12 - 46 %   Lymphs Abs 2.9 0.7 - 4.0 K/uL   Monocytes Relative 11 3 - 12 %   Monocytes Absolute 1.5 (H) 0.1 - 1.0 K/uL   Eosinophils Relative 1 0 - 5 %   Eosinophils Absolute 0.2 0.0 - 0.7 K/uL   Basophils Relative 0 0 - 1 %   Basophils Absolute 0.0 0.0 - 0.1 K/uL  Comprehensive metabolic panel     Status: Abnormal   Collection Time: 07/06/14  7:55 AM  Result Value Ref Range   Sodium 138 135 - 145 mmol/L   Potassium 4.1 3.5 - 5.1 mmol/L   Chloride 103 101 - 111 mmol/L   CO2 25 22 - 32 mmol/L   Glucose, Bld 95 70 - 99 mg/dL   BUN 44 (H) 6 - 20 mg/dL   Creatinine, Ser 1.39 (H) 0.44 - 1.00 mg/dL   Calcium 10.9 (H) 8.9 - 10.3 mg/dL   Total Protein 6.1 (L) 6.5 - 8.1 g/dL   Albumin 3.3 (L) 3.5 - 5.0 g/dL   AST 22 15 - 41 U/L   ALT 16 14 - 54 U/L   Alkaline Phosphatase 52 38 - 126 U/L    Total Bilirubin 0.8 0.3 - 1.2 mg/dL   GFR calc non Af Amer 35 (L) >60 mL/min   GFR calc Af Amer 40 (L) >60 mL/min    Comment: (NOTE) The eGFR has been calculated using the CKD EPI equation. This calculation has not been validated in all clinical situations. eGFR's persistently <60 mL/min signify possible Chronic Kidney Disease.    Anion gap 10 5 - 15  Type and screen     Status: None   Collection Time: 07/06/14  7:56 AM  Result Value Ref Range   ABO/RH(D) A POS    Antibody Screen NEG    Sample Expiration 07/09/2014   ABO/Rh  Status: None   Collection Time: 07/06/14  7:56 AM  Result Value Ref Range   ABO/RH(D) A POS   I-stat troponin, ED     Status: None   Collection Time: 07/06/14  8:05 AM  Result Value Ref Range   Troponin i, poc 0.00 0.00 - 0.08 ng/mL   Comment 3            Comment: Due to the release kinetics of cTnI, a negative result within the first hours of the onset of symptoms does not rule out myocardial infarction with certainty. If myocardial infarction is still suspected, repeat the test at appropriate intervals.     Radiology Reports: Dg Chest 1 View  07/06/2014   CLINICAL DATA:  Syncope, fell this morning at work, LEFT hip injury, history asthma, hypertension  EXAM: CHEST  1 VIEW  COMPARISON:  03/05/2007  FINDINGS: Mild enlargement of cardiac silhouette.  Atherosclerotic calcification aorta.  Mediastinal contours and pulmonary vascularity normal.  Mild bronchitic changes.  Skin fold projects over RIGHT upper lobe.  No definite infiltrate, pleural effusion or pneumothorax.  Osseous demineralization.  IMPRESSION: Mild enlargement of cardiac silhouette.  Bronchitic changes.   Electronically Signed   By: Lavonia Dana M.D.   On: 07/06/2014 08:47   Ct Head Wo Contrast  07/06/2014   CLINICAL DATA:  Became dizzy and fell, headache, history asthma, irregular heartbeat, hypertension  EXAM: CT HEAD WITHOUT CONTRAST  CT CERVICAL SPINE WITHOUT CONTRAST  TECHNIQUE:  Multidetector CT imaging of the head and cervical spine was performed following the standard protocol without intravenous contrast. Multiplanar CT image reconstructions of the cervical spine were also generated.  COMPARISON:  None  FINDINGS: CT HEAD FINDINGS  Beam hardening artifacts of dental origin.  Normal ventricular morphology.  No midline shift or mass effect.  Mild small vessel chronic ischemic changes of deep cerebral white matter.  No intracranial hemorrhage, mass lesion, or evidence acute infarction.  No extra-axial fluid collections.  Atherosclerotic calcifications at carotid siphons.  Bones demineralized.  Minimal fluid or mucus in RIGHT maxillary sinus with remaining sinuses and mastoid air cells clear.  CT CERVICAL SPINE FINDINGS  Prevertebral soft tissues normal thickness.  Bones demineralized with probable discogenic sclerosis at inferior C2.  Visualized skullbase intact.  Multilevel disc space narrowing and endplate spur formation throughout cervical spine.  Mild scattered facet degenerative changes.  Vertebral body heights maintained without fracture or subluxation.  Encroachment upon RIGHT C4-C5 and C5-C6 neural foramina by significant uncovertebral spurs.  Lung apices clear.  Atherosclerotic calcifications of the carotid systems bilaterally.  IMPRESSION: Atrophy with small vessel chronic ischemic changes of deep cerebral white matter.  No acute intracranial abnormalities.  Degenerative disc and facet disease changes of the cervical spine as described above.  No acute cervical spine abnormalities.   Electronically Signed   By: Lavonia Dana M.D.   On: 07/06/2014 10:12   Ct Cervical Spine Wo Contrast  07/06/2014   CLINICAL DATA:  Became dizzy and fell, headache, history asthma, irregular heartbeat, hypertension  EXAM: CT HEAD WITHOUT CONTRAST  CT CERVICAL SPINE WITHOUT CONTRAST  TECHNIQUE: Multidetector CT imaging of the head and cervical spine was performed following the standard protocol without  intravenous contrast. Multiplanar CT image reconstructions of the cervical spine were also generated.  COMPARISON:  None  FINDINGS: CT HEAD FINDINGS  Beam hardening artifacts of dental origin.  Normal ventricular morphology.  No midline shift or mass effect.  Mild small vessel chronic ischemic changes of deep cerebral white  matter.  No intracranial hemorrhage, mass lesion, or evidence acute infarction.  No extra-axial fluid collections.  Atherosclerotic calcifications at carotid siphons.  Bones demineralized.  Minimal fluid or mucus in RIGHT maxillary sinus with remaining sinuses and mastoid air cells clear.  CT CERVICAL SPINE FINDINGS  Prevertebral soft tissues normal thickness.  Bones demineralized with probable discogenic sclerosis at inferior C2.  Visualized skullbase intact.  Multilevel disc space narrowing and endplate spur formation throughout cervical spine.  Mild scattered facet degenerative changes.  Vertebral body heights maintained without fracture or subluxation.  Encroachment upon RIGHT C4-C5 and C5-C6 neural foramina by significant uncovertebral spurs.  Lung apices clear.  Atherosclerotic calcifications of the carotid systems bilaterally.  IMPRESSION: Atrophy with small vessel chronic ischemic changes of deep cerebral white matter.  No acute intracranial abnormalities.  Degenerative disc and facet disease changes of the cervical spine as described above.  No acute cervical spine abnormalities.   Electronically Signed   By: Lavonia Dana M.D.   On: 07/06/2014 10:12   Dg Hip Unilat With Pelvis 2-3 Views Left  07/06/2014   CLINICAL DATA:  [place] this morning at work, LEFT hip deformity, initial encounter  EXAM: LEFT HIP (WITH PELVIS) 2-3 VIEWS  COMPARISON:  None  FINDINGS: Diffuse osseous demineralization.  Displaced LEFT femoral neck fracture.  No dislocation or additional fractures.  Hip joint spaces appear slightly narrowed bilaterally.  Mild sclerosis and irregularity of the SI joints bilaterally  suggesting sacroiliitis.  Degenerative disc and facet disease changes at visualized lower lumbar spine.  IMPRESSION: Displaced LEFT femoral neck fracture.   Electronically Signed   By: Lavonia Dana M.D.   On: 07/06/2014 08:45    Electrocardiogram: Sinus rhythm, 63 bpm. Normal axis. No Q waves. PVCs. Subtle ST changes in the inferior leads. No older EKG available for comparison.  Problem List  Principal Problem:   Syncope Active Problems:   Closed left hip fracture   Essential hypertension   Osteoporosis   Assessment: This is a 81 year old Caucasian female who presents after a syncopal episode which resulted in fracture of her left hip. She is recovering from an episode of acute bronchitis. Her labwork suggest some degree of dehydration. This probably contributed to her syncope. EKG is abnormal but patient denies any chest pain or shortness of breath. She has moderate to good functional capacity.  Plan: #1 syncope: Most likely due to dehydration. She'll be given IV fluids. Due to presence of abnormal EKG we will proceed with echocardiogram. EKG and troponin will be repeated. Orthostatics cannot be checked as she has a hip fracture. Hold her diuretics. Check UA. Her old records were reviewed. The LBBB is an old finding initially detected in 2009. She had an episode of SVT at that time and was seen by cardiology. She underwent a stress test which did not show any reversible ischemia. Initial echocardiogram did suggest cardiomyopathy and one was repeated a few months later, which showed improvement in her systolic function.  #2 dehydration with mild acute renal failure: Gentle IV hydration. Repeat labs in the morning. Monitor urine output.  #3 Left hip fracture: Management per orthopedics. Dr. Percell Miller to operate when she has completed the workup as discussed earlier. Pain management.  Preoperative evaluation Cardiac: Based on the [facility] Perioperative Risk Index the patient's estimated risk  probability for perioperative myocardial infarction or cardiac arrest is 0.21. Patient's procedure is intermediate risk. Patient's functional capacity is moderate to good. Workup as discussed above. If all of this workup is low  risk she may proceed to surgery without any further testing. Continue with the beta blocker.  Pulmonary: Pulmonary toilet. Incentive spirometry post operatively.  #4 Mild hypercalcemia: Likely due to dehydration. Repeat tomorrow morning.  #5 history of osteoporosis: Continue with her Fosamax at discharge.  #6 history of essential hypertension: Continue with the beta blockers. Monitor blood pressures closely. Hold her ACE inhibitor and diuretics due to dehydration.  ADDENDUM Repeat troponin is normal. Echocardiogram report has been reviewed. It is low risk. Patient may proceed to surgery without further testing.  DVT Prophylaxis: SCDs Code Status: Full code Family Communication: Discussed with the patient and her daughter  Disposition Plan: Admit to telemetry   Further management decisions will depend on results of further testing and patient's response to treatment.   [facility]  [facility] Pager [phone]  If 7PM-7AM, please contact night-coverage [email or web address] Password [facility]  07/06/2014, 11:39 AM

## 2014-07-06 NOTE — ED Notes (Signed)
Attempted report 

## 2014-07-06 NOTE — Consult Note (Signed)
ORTHOPAEDIC CONSULTATION  REQUESTING PHYSICIAN: Milton Ferguson, MD  Chief Complaint: Left hip fracture  HPI: Deanna Diaz is a 81 y.o. female who complains of a syncopal onto a fall onto her left side. Complains of left hip pain.  Past Medical History  Diagnosis Date  . Asthma   . Irregular heart beat   . Hypertension    History reviewed. No pertinent past surgical history. History   Social History  . Marital Status: Married    Spouse Name: N/A  . Number of Children: N/A  . Years of Education: N/A   Social History Main Topics  . Smoking status: Never Smoker   . Smokeless tobacco: Not on file  . Alcohol Use: No  . Drug Use: No  . Sexual Activity: Not on file   Other Topics Concern  . None   Social History Narrative   No family history on file. Allergies  Allergen Reactions  . Dilaudid [Hydromorphone] Nausea And Vomiting  . Hydrocodone Nausea And Vomiting  . Oxycodone Nausea And Vomiting   Prior to Admission medications   Medication Sig Start Date End Date Taking? Authorizing Provider  acetaminophen (TYLENOL) 500 MG tablet Take 500 mg by mouth every 6 (six) hours as needed for mild pain or moderate pain.   Yes Historical Provider, MD  alendronate (FOSAMAX) 70 MG tablet Take 70 mg by mouth every 7 (seven) days. thursdays   Yes Historical Provider, MD  escitalopram (LEXAPRO) 10 MG tablet Take 10 mg by mouth daily.   Yes Historical Provider, MD  hydroxypropyl methylcellulose / hypromellose (ISOPTO TEARS / GONIOVISC) 2.5 % ophthalmic solution Place 1 drop into both eyes as needed for dry eyes.   Yes Historical Provider, MD  lisinopril-hydrochlorothiazide (PRINZIDE,ZESTORETIC) 20-25 MG per tablet Take 1 tablet by mouth daily.   Yes Historical Provider, MD  metoprolol (LOPRESSOR) 50 MG tablet Take 50 mg by mouth 2 (two) times daily.   Yes Historical Provider, MD   Dg Chest 1 View  07/06/2014   CLINICAL DATA:  Syncope, fell this morning at work, LEFT hip injury,  history asthma, hypertension  EXAM: CHEST  1 VIEW  COMPARISON:  03/05/2007  FINDINGS: Mild enlargement of cardiac silhouette.  Atherosclerotic calcification aorta.  Mediastinal contours and pulmonary vascularity normal.  Mild bronchitic changes.  Skin fold projects over RIGHT upper lobe.  No definite infiltrate, pleural effusion or pneumothorax.  Osseous demineralization.  IMPRESSION: Mild enlargement of cardiac silhouette.  Bronchitic changes.   Electronically Signed   By: Lavonia Dana M.D.   On: 07/06/2014 08:47   Ct Head Wo Contrast  07/06/2014   CLINICAL DATA:  Became dizzy and fell, headache, history asthma, irregular heartbeat, hypertension  EXAM: CT HEAD WITHOUT CONTRAST  CT CERVICAL SPINE WITHOUT CONTRAST  TECHNIQUE: Multidetector CT imaging of the head and cervical spine was performed following the standard protocol without intravenous contrast. Multiplanar CT image reconstructions of the cervical spine were also generated.  COMPARISON:  None  FINDINGS: CT HEAD FINDINGS  Beam hardening artifacts of dental origin.  Normal ventricular morphology.  No midline shift or mass effect.  Mild small vessel chronic ischemic changes of deep cerebral white matter.  No intracranial hemorrhage, mass lesion, or evidence acute infarction.  No extra-axial fluid collections.  Atherosclerotic calcifications at carotid siphons.  Bones demineralized.  Minimal fluid or mucus in RIGHT maxillary sinus with remaining sinuses and mastoid air cells clear.  CT CERVICAL SPINE FINDINGS  Prevertebral soft tissues normal thickness.  Bones  demineralized with probable discogenic sclerosis at inferior C2.  Visualized skullbase intact.  Multilevel disc space narrowing and endplate spur formation throughout cervical spine.  Mild scattered facet degenerative changes.  Vertebral body heights maintained without fracture or subluxation.  Encroachment upon RIGHT C4-C5 and C5-C6 neural foramina by significant uncovertebral spurs.  Lung apices  clear.  Atherosclerotic calcifications of the carotid systems bilaterally.  IMPRESSION: Atrophy with small vessel chronic ischemic changes of deep cerebral white matter.  No acute intracranial abnormalities.  Degenerative disc and facet disease changes of the cervical spine as described above.  No acute cervical spine abnormalities.   Electronically Signed   By: Lavonia Dana M.D.   On: 07/06/2014 10:12   Ct Cervical Spine Wo Contrast  07/06/2014   CLINICAL DATA:  Became dizzy and fell, headache, history asthma, irregular heartbeat, hypertension  EXAM: CT HEAD WITHOUT CONTRAST  CT CERVICAL SPINE WITHOUT CONTRAST  TECHNIQUE: Multidetector CT imaging of the head and cervical spine was performed following the standard protocol without intravenous contrast. Multiplanar CT image reconstructions of the cervical spine were also generated.  COMPARISON:  None  FINDINGS: CT HEAD FINDINGS  Beam hardening artifacts of dental origin.  Normal ventricular morphology.  No midline shift or mass effect.  Mild small vessel chronic ischemic changes of deep cerebral white matter.  No intracranial hemorrhage, mass lesion, or evidence acute infarction.  No extra-axial fluid collections.  Atherosclerotic calcifications at carotid siphons.  Bones demineralized.  Minimal fluid or mucus in RIGHT maxillary sinus with remaining sinuses and mastoid air cells clear.  CT CERVICAL SPINE FINDINGS  Prevertebral soft tissues normal thickness.  Bones demineralized with probable discogenic sclerosis at inferior C2.  Visualized skullbase intact.  Multilevel disc space narrowing and endplate spur formation throughout cervical spine.  Mild scattered facet degenerative changes.  Vertebral body heights maintained without fracture or subluxation.  Encroachment upon RIGHT C4-C5 and C5-C6 neural foramina by significant uncovertebral spurs.  Lung apices clear.  Atherosclerotic calcifications of the carotid systems bilaterally.  IMPRESSION: Atrophy with small  vessel chronic ischemic changes of deep cerebral white matter.  No acute intracranial abnormalities.  Degenerative disc and facet disease changes of the cervical spine as described above.  No acute cervical spine abnormalities.   Electronically Signed   By: Lavonia Dana M.D.   On: 07/06/2014 10:12   Dg Hip Unilat With Pelvis 2-3 Views Left  07/06/2014   CLINICAL DATA:  [place] this morning at work, LEFT hip deformity, initial encounter  EXAM: LEFT HIP (WITH PELVIS) 2-3 VIEWS  COMPARISON:  None  FINDINGS: Diffuse osseous demineralization.  Displaced LEFT femoral neck fracture.  No dislocation or additional fractures.  Hip joint spaces appear slightly narrowed bilaterally.  Mild sclerosis and irregularity of the SI joints bilaterally suggesting sacroiliitis.  Degenerative disc and facet disease changes at visualized lower lumbar spine.  IMPRESSION: Displaced LEFT femoral neck fracture.   Electronically Signed   By: Lavonia Dana M.D.   On: 07/06/2014 08:45    Positive ROS: All other systems have been reviewed and were otherwise negative with the exception of those mentioned in the HPI and as above.  Labs cbc  Recent Labs  07/06/14 0755  WBC 14.4*  HGB 12.4  HCT 37.8  PLT 262    Labs inflam No results for input(s): CRP in the last 72 hours.  Invalid input(s): ESR  Labs coag No results for input(s): INR, PTT in the last 72 hours.  Invalid input(s): PT   Recent Labs  07/06/14 0755  NA 138  K 4.1  CL 103  CO2 25  GLUCOSE 95  BUN 44*  CREATININE 1.39*  CALCIUM 10.9*    Physical Exam: Filed Vitals:   07/06/14 0936  BP: 161/70  Pulse: 66  Temp:   Resp: 18   General: Alert, no acute distress Cardiovascular: No pedal edema Respiratory: No cyanosis, no use of accessory musculature GI: No organomegaly, abdomen is soft and non-tender Skin: No lesions in the area of chief complaint other than those listed below in MSK exam.  Neurologic: Sensation intact distally Psychiatric:  Patient is competent for consent with normal mood and affect Lymphatic: No axillary or cervical lymphadenopathy  MUSCULOSKELETAL:  LLE: She has pain with logroll her skin is benign her compartments are soft she is distally neurovascularly intact. Other extremities are atraumatic with painless ROM and NVI.  Assessment: Left displaced femoral neck fracture  Plan: Left hip hemiarthroplasty When cleared   Renette Butters, MD Cell [phone]   07/06/2014 11:08 AM

## 2014-07-06 NOTE — Discharge Instructions (Signed)
Continue post hip precautions for 6wks  Bear weight as tolerated  Keep incision dry and covered until follow up

## 2014-07-06 NOTE — Anesthesia Postprocedure Evaluation (Signed)
  Anesthesia Post-op Note  Patient: Deanna Diaz  Procedure(s) Performed: Procedure(s): ARTHROPLASTY BIPOLAR HIP (HEMIARTHROPLASTY) (Left)  Patient Location: PACU  Anesthesia Type:General and Spinal  Level of Consciousness: awake, alert  and oriented  Airway and Oxygen Therapy: Patient Spontanous Breathing and Patient connected to nasal cannula oxygen  Post-op Pain: none  Post-op Assessment: Post-op Vital signs reviewed, Patient's Cardiovascular Status Stable, Respiratory Function Stable, Patent Airway and Pain level controlled  Post-op Vital Signs: stable  Last Vitals:  Filed Vitals:   07/06/14 2245  BP: 156/61  Pulse: 72  Temp:   Resp: 11    Complications: No apparent anesthesia complications

## 2014-07-06 NOTE — ED Provider Notes (Addendum)
CSN: [id number]     Arrival date & time 07/06/14  [id number] History   First MD Initiated Contact with Patient 07/06/14 [id number]     Chief Complaint  Patient presents with  . Hip Pain  . Fall     (Consider location/radiation/quality/duration/timing/severity/associated sxs/prior Treatment) Patient is a 81 y.o. female presenting with hip pain and fall. The history is provided by the patient (the pt complains of syncopal episode at work with hip pain).  Hip Pain This is a new problem. The current episode started 1 to 2 hours ago. The problem occurs constantly. The problem has not changed since onset.Pertinent negatives include no chest pain, no abdominal pain and no headaches. Exacerbated by: movement. Nothing relieves the symptoms.  Fall Pertinent negatives include no chest pain, no abdominal pain and no headaches.    Past Medical History  Diagnosis Date  . Asthma   . Irregular heart beat   . Hypertension    History reviewed. No pertinent past surgical history. No family history on file. History  Substance Use Topics  . Smoking status: Never Smoker   . Smokeless tobacco: Not on file  . Alcohol Use: No   OB History    No data available     Review of Systems  Constitutional: Negative for appetite change and fatigue.  HENT: Negative for congestion, ear discharge and sinus pressure.   Eyes: Negative for discharge.  Respiratory: Negative for cough.   Cardiovascular: Negative for chest pain.  Gastrointestinal: Negative for abdominal pain and diarrhea.  Genitourinary: Negative for frequency and hematuria.  Musculoskeletal: Negative for back pain.       Hip pain  Skin: Negative for rash.  Neurological: Negative for seizures and headaches.  Psychiatric/Behavioral: Negative for hallucinations.      Allergies  Dilaudid; Hydrocodone; and Oxycodone  Home Medications   Prior to Admission medications   Medication Sig Start Date End Date Taking? Authorizing Provider  acetaminophen  (TYLENOL) 500 MG tablet Take 500 mg by mouth every 6 (six) hours as needed for mild pain or moderate pain.   Yes Historical Provider, MD  alendronate (FOSAMAX) 70 MG tablet Take 70 mg by mouth every 7 (seven) days. thursdays   Yes Historical Provider, MD  escitalopram (LEXAPRO) 10 MG tablet Take 10 mg by mouth daily.   Yes Historical Provider, MD  hydroxypropyl methylcellulose / hypromellose (ISOPTO TEARS / GONIOVISC) 2.5 % ophthalmic solution Place 1 drop into both eyes as needed for dry eyes.   Yes Historical Provider, MD  lisinopril-hydrochlorothiazide (PRINZIDE,ZESTORETIC) 20-25 MG per tablet Take 1 tablet by mouth daily.   Yes Historical Provider, MD  metoprolol (LOPRESSOR) 50 MG tablet Take 50 mg by mouth 2 (two) times daily.   Yes Historical Provider, MD   BP 161/70 mmHg  Pulse 66  Temp(Src) 97.5 F (36.4 C) (Oral)  Resp 18  Ht 5\' 3"  (1.6 m)  Wt 127 lb (57.607 kg)  BMI 22.50 kg/m2  SpO2 100% Physical Exam  Constitutional: She is oriented to person, place, and time. She appears well-developed.  HENT:  Head: Normocephalic.  Eyes: Conjunctivae and EOM are normal. No scleral icterus.  Neck: Neck supple. No thyromegaly present.  Cardiovascular: Normal rate and regular rhythm.  Exam reveals no gallop and no friction rub.   No murmur heard. Pulmonary/Chest: No stridor. She has no wheezes. She has no rales. She exhibits no tenderness.  Abdominal: She exhibits no distension. There is no tenderness. There is no rebound.  Musculoskeletal: She exhibits no edema.  Tender right hip  Lymphadenopathy:    She has no cervical adenopathy.  Neurological: She is oriented to person, place, and time. She exhibits normal muscle tone. Coordination normal.  Skin: No rash noted. No erythema.  Psychiatric: She has a normal mood and affect. Her behavior is normal.    ED Course  Procedures (including critical care time) Labs Review Labs Reviewed  CBC WITH DIFFERENTIAL/PLATELET - Abnormal; Notable for  the following:    WBC 14.4 (*)    Neutro Abs 9.8 (*)    Monocytes Absolute 1.5 (*)    All other components within normal limits  COMPREHENSIVE METABOLIC PANEL - Abnormal; Notable for the following:    BUN 44 (*)    Creatinine, Ser 1.39 (*)    Calcium 10.9 (*)    Total Protein 6.1 (*)    Albumin 3.3 (*)    GFR calc non Af Amer 35 (*)    GFR calc Af Amer 40 (*)    All other components within normal limits  URINALYSIS, ROUTINE W REFLEX MICROSCOPIC  I-STAT TROPOININ, ED  TYPE AND SCREEN  ABO/RH    Imaging Review Dg Chest 1 View  07/06/2014   CLINICAL DATA:  Syncope, fell this morning at work, LEFT hip injury, history asthma, hypertension  EXAM: CHEST  1 VIEW  COMPARISON:  03/05/2007  FINDINGS: Mild enlargement of cardiac silhouette.  Atherosclerotic calcification aorta.  Mediastinal contours and pulmonary vascularity normal.  Mild bronchitic changes.  Skin fold projects over RIGHT upper lobe.  No definite infiltrate, pleural effusion or pneumothorax.  Osseous demineralization.  IMPRESSION: Mild enlargement of cardiac silhouette.  Bronchitic changes.   Electronically Signed   By: Ulyses Southward M.D.   On: 07/06/2014 08:47   Ct Head Wo Contrast  07/06/2014   CLINICAL DATA:  Became dizzy and fell, headache, history asthma, irregular heartbeat, hypertension  EXAM: CT HEAD WITHOUT CONTRAST  CT CERVICAL SPINE WITHOUT CONTRAST  TECHNIQUE: Multidetector CT imaging of the head and cervical spine was performed following the standard protocol without intravenous contrast. Multiplanar CT image reconstructions of the cervical spine were also generated.  COMPARISON:  None  FINDINGS: CT HEAD FINDINGS  Beam hardening artifacts of dental origin.  Normal ventricular morphology.  No midline shift or mass effect.  Mild small vessel chronic ischemic changes of deep cerebral white matter.  No intracranial hemorrhage, mass lesion, or evidence acute infarction.  No extra-axial fluid collections.  Atherosclerotic  calcifications at carotid siphons.  Bones demineralized.  Minimal fluid or mucus in RIGHT maxillary sinus with remaining sinuses and mastoid air cells clear.  CT CERVICAL SPINE FINDINGS  Prevertebral soft tissues normal thickness.  Bones demineralized with probable discogenic sclerosis at inferior C2.  Visualized skullbase intact.  Multilevel disc space narrowing and endplate spur formation throughout cervical spine.  Mild scattered facet degenerative changes.  Vertebral body heights maintained without fracture or subluxation.  Encroachment upon RIGHT C4-C5 and C5-C6 neural foramina by significant uncovertebral spurs.  Lung apices clear.  Atherosclerotic calcifications of the carotid systems bilaterally.  IMPRESSION: Atrophy with small vessel chronic ischemic changes of deep cerebral white matter.  No acute intracranial abnormalities.  Degenerative disc and facet disease changes of the cervical spine as described above.  No acute cervical spine abnormalities.   Electronically Signed   By: Ulyses Southward M.D.   On: 07/06/2014 10:12   Ct Cervical Spine Wo Contrast  07/06/2014   CLINICAL DATA:  Became dizzy and fell, headache, history asthma, irregular heartbeat, hypertension  EXAM: CT HEAD WITHOUT CONTRAST  CT CERVICAL SPINE WITHOUT CONTRAST  TECHNIQUE: Multidetector CT imaging of the head and cervical spine was performed following the standard protocol without intravenous contrast. Multiplanar CT image reconstructions of the cervical spine were also generated.  COMPARISON:  None  FINDINGS: CT HEAD FINDINGS  Beam hardening artifacts of dental origin.  Normal ventricular morphology.  No midline shift or mass effect.  Mild small vessel chronic ischemic changes of deep cerebral white matter.  No intracranial hemorrhage, mass lesion, or evidence acute infarction.  No extra-axial fluid collections.  Atherosclerotic calcifications at carotid siphons.  Bones demineralized.  Minimal fluid or mucus in RIGHT maxillary sinus  with remaining sinuses and mastoid air cells clear.  CT CERVICAL SPINE FINDINGS  Prevertebral soft tissues normal thickness.  Bones demineralized with probable discogenic sclerosis at inferior C2.  Visualized skullbase intact.  Multilevel disc space narrowing and endplate spur formation throughout cervical spine.  Mild scattered facet degenerative changes.  Vertebral body heights maintained without fracture or subluxation.  Encroachment upon RIGHT C4-C5 and C5-C6 neural foramina by significant uncovertebral spurs.  Lung apices clear.  Atherosclerotic calcifications of the carotid systems bilaterally.  IMPRESSION: Atrophy with small vessel chronic ischemic changes of deep cerebral white matter.  No acute intracranial abnormalities.  Degenerative disc and facet disease changes of the cervical spine as described above.  No acute cervical spine abnormalities.   Electronically Signed   By: Ulyses SouthwardMark  Boles M.D.   On: 07/06/2014 10:12   Dg Hip Unilat With Pelvis 2-3 Views Left  07/06/2014   CLINICAL DATA:  Larey SeatFell this morning at work, LEFT hip deformity, initial encounter  EXAM: LEFT HIP (WITH PELVIS) 2-3 VIEWS  COMPARISON:  None  FINDINGS: Diffuse osseous demineralization.  Displaced LEFT femoral neck fracture.  No dislocation or additional fractures.  Hip joint spaces appear slightly narrowed bilaterally.  Mild sclerosis and irregularity of the SI joints bilaterally suggesting sacroiliitis.  Degenerative disc and facet disease changes at visualized lower lumbar spine.  IMPRESSION: Displaced LEFT femoral neck fracture.   Electronically Signed   By: Ulyses SouthwardMark  Boles M.D.   On: 07/06/2014 08:45     EKG Interpretation   Date/Time:  Tuesday Jul 06 2014 08:04:13 EDT Ventricular Rate:  63 PR Interval:  202 QRS Duration: 134 QT Interval:  401 QTC Calculation: 410 R Axis:   41 Text Interpretation:  Sinus rhythm Ventricular premature complex IVCD,  consider atypical LBBB Confirmed by Sevrin [name]  MD, Aislin Onofre [phone]([id number]) on  07/06/2014  10:42:26 AM      MDM   Final diagnoses:  Fall  Syncope    Admit for syncope and hip fx    Bethann BerkshireJoseph Meelah Tallo, MD 07/06/14 1116  Bethann BerkshireJoseph Dann Galicia, MD 07/17/14 [id number]  Bethann BerkshireJoseph Jimmye Wisnieski, MD 07/17/14 [phone]

## 2014-07-07 ENCOUNTER — Encounter (HOSPITAL_COMMUNITY): Payer: Self-pay | Admitting: Orthopedic Surgery

## 2014-07-07 DIAGNOSIS — E43 Unspecified severe protein-calorie malnutrition: Secondary | ICD-10-CM

## 2014-07-07 DIAGNOSIS — I9581 Postprocedural hypotension: Secondary | ICD-10-CM

## 2014-07-07 DIAGNOSIS — S72002S Fracture of unspecified part of neck of left femur, sequela: Secondary | ICD-10-CM

## 2014-07-07 LAB — BASIC METABOLIC PANEL
ANION GAP: 12 (ref 5–15)
BUN: 29 mg/dL — ABNORMAL HIGH (ref 6–20)
CALCIUM: 9.5 mg/dL (ref 8.9–10.3)
CO2: 20 mmol/L — ABNORMAL LOW (ref 22–32)
Chloride: 103 mmol/L (ref 101–111)
Creatinine, Ser: 1.02 mg/dL — ABNORMAL HIGH (ref 0.44–1.00)
GFR calc non Af Amer: 50 mL/min — ABNORMAL LOW (ref >60)
GFR, EST AFRICAN AMERICAN: 58 mL/min — AB (ref >60)
Glucose, Bld: 85 mg/dL (ref 70–99)
POTASSIUM: 5 mmol/L (ref 3.5–5.1)
Sodium: 135 mmol/L (ref 135–145)

## 2014-07-07 LAB — CBC
HEMATOCRIT: 37.4 % (ref 36.0–46.0)
Hemoglobin: 12.4 g/dL (ref 12.0–15.0)
MCH: 28.4 pg (ref 26.0–34.0)
MCHC: 33.2 g/dL (ref 30.0–36.0)
MCV: 85.8 fL (ref 78.0–100.0)
Platelets: 179 10*3/uL (ref 150–400)
RBC: 4.36 MIL/uL (ref 3.87–5.11)
RDW: 12.9 % (ref 11.5–15.5)
WBC: 14.7 10*3/uL — AB (ref 4.0–10.5)

## 2014-07-07 MED ORDER — BOOST / RESOURCE BREEZE PO LIQD
1.0000 | Freq: Three times a day (TID) | ORAL | Status: DC
  Administered 2014-07-07 (×2): 1 via ORAL

## 2014-07-07 MED ORDER — SODIUM CHLORIDE 0.9 % IV SOLN
INTRAVENOUS | Status: DC
  Administered 2014-07-07 – 2014-07-08 (×3): via INTRAVENOUS

## 2014-07-07 MED ORDER — CETYLPYRIDINIUM CHLORIDE 0.05 % MT LIQD
7.0000 mL | Freq: Two times a day (BID) | OROMUCOSAL | Status: DC
  Administered 2014-07-07 – 2014-07-09 (×5): 7 mL via OROMUCOSAL

## 2014-07-07 MED ORDER — SODIUM CHLORIDE 0.9 % IV BOLUS (SEPSIS)
1000.0000 mL | Freq: Once | INTRAVENOUS | Status: AC
  Administered 2014-07-07: 1000 mL via INTRAVENOUS

## 2014-07-07 MED ORDER — SODIUM CHLORIDE 0.9 % IV SOLN
INTRAVENOUS | Status: AC
  Administered 2014-07-07: 01:00:00 via INTRAVENOUS

## 2014-07-07 NOTE — Progress Notes (Signed)
[facility] PROGRESS NOTE  Marc MorgansLaura H Bartee ZOX:[id number] DOB: 1930/06/22 DOA: 07/06/2014 PCP: Ailene RavelHAMRICK,MAURA L, MD  Assessment/Plan:  Principal Problem:   Syncope - Most likely secondary to dehydration - Unable to obtain orthostatics given recent left hip fracture - We'll encourage oral intake and provide IV fluid rehydration  Hypotension - No active bleeding and most likely related to dehydration. Will liberalize diet  Active Problems:   Closed left hip fracture -Orthopedic surgeries on board and assisting with management - Continue supportive therapy. While blood pressure soft will have to hold IV opioid regimen    Essential hypertension -Patient is on metoprolol at home. We'll discontinue this medication and we will have hypotension    Protein-calorie malnutrition, severe -Will liberalize diet to heart healthy diet  Code Status: full Family Communication: No family at bedside Disposition Plan: Pending improvement in condition.   Consultants:  Orthopaedic surgery  Procedures: POD#1 L Hip hemiarthroplasty  Antibiotics:  None  HPI/Subjective: Pt patient was complaining of diaphoresis and was found to have hypotension. Resolved after obtaining fluid bolus. The patient has no new complaints otherwise  Objective: Filed Vitals:   07/07/14 1315  BP: 79/32  Pulse:   Temp:   Resp:     Intake/Output Summary (Last 24 hours) at 07/07/14 1621 Last data filed at 07/07/14 1150  Gross per 24 hour  Intake 1765.83 ml  Output    900 ml  Net 865.83 ml   Filed Weights   07/06/14 0739 07/06/14 1348 07/07/14 0518  Weight: 57.607 kg (127 lb) 53.4 kg (117 lb 11.6 oz) 55.747 kg (122 lb 14.4 oz)    Exam:   General:  Patient in no acute distress, alert and awake, oriented 3  Cardiovascular: Regular rate and rhythm, no rubs  Respiratory: Clear to auscultation bilaterally, no wheezes  Abdomen: Soft, nondistended, nontender  Musculoskeletal: Normal tone bilaterally    Data Reviewed: Basic Metabolic Panel:  Recent Labs Lab 07/06/14 0755 07/07/14 0536  NA 138 135  K 4.1 5.0  CL 103 103  CO2 25 20*  GLUCOSE 95 85  BUN 44* 29*  CREATININE 1.39* 1.02*  CALCIUM 10.9* 9.5   Liver Function Tests:  Recent Labs Lab 07/06/14 0755  AST 22  ALT 16  ALKPHOS 52  BILITOT 0.8  PROT 6.1*  ALBUMIN 3.3*   No results for input(s): LIPASE, AMYLASE in the last 168 hours. No results for input(s): AMMONIA in the last 168 hours. CBC:  Recent Labs Lab 07/06/14 0755 07/07/14 0536  WBC 14.4* 14.7*  NEUTROABS 9.8*  --   HGB 12.4 12.4  HCT 37.8 37.4  MCV 86.5 85.8  PLT 262 179   Cardiac Enzymes:  Recent Labs Lab 07/06/14 1340  TROPONINI <0.03   BNP (last 3 results) No results for input(s): BNP in the last 8760 hours.  ProBNP (last 3 results) No results for input(s): PROBNP in the last 8760 hours.  CBG: No results for input(s): GLUCAP in the last 168 hours.  Recent Results (from the past 240 hour(s))  Surgical pcr screen     Status: None   Collection Time: 07/06/14  4:42 PM  Result Value Ref Range Status   MRSA, PCR NEGATIVE NEGATIVE Final   Staphylococcus aureus NEGATIVE NEGATIVE Final    Comment:        The Xpert SA Assay (FDA approved for NASAL specimens in patients over 79 years of age), is one component of a comprehensive surveillance program.  Test performance has been validated by [facility] for patients greater than or equal to 18 year old. It is not intended to diagnose infection nor to guide or monitor treatment.      Studies: Dg Chest 1 View  07/06/2014   CLINICAL DATA:  Syncope, fell this morning at work, LEFT hip injury, history asthma, hypertension  EXAM: CHEST  1 VIEW  COMPARISON:  03/05/2007  FINDINGS: Mild enlargement of cardiac silhouette.  Atherosclerotic calcification aorta.  Mediastinal contours and pulmonary vascularity normal.  Mild bronchitic changes.  Skin fold projects over RIGHT upper lobe.  No  definite infiltrate, pleural effusion or pneumothorax.  Osseous demineralization.  IMPRESSION: Mild enlargement of cardiac silhouette.  Bronchitic changes.   Electronically Signed   By: Ulyses Southward M.D.   On: 07/06/2014 08:47   Ct Head Wo Contrast  07/06/2014   CLINICAL DATA:  Became dizzy and fell, headache, history asthma, irregular heartbeat, hypertension  EXAM: CT HEAD WITHOUT CONTRAST  CT CERVICAL SPINE WITHOUT CONTRAST  TECHNIQUE: Multidetector CT imaging of the head and cervical spine was performed following the standard protocol without intravenous contrast. Multiplanar CT image reconstructions of the cervical spine were also generated.  COMPARISON:  None  FINDINGS: CT HEAD FINDINGS  Beam hardening artifacts of dental origin.  Normal ventricular morphology.  No midline shift or mass effect.  Mild small vessel chronic ischemic changes of deep cerebral white matter.  No intracranial hemorrhage, mass lesion, or evidence acute infarction.  No extra-axial fluid collections.  Atherosclerotic calcifications at carotid siphons.  Bones demineralized.  Minimal fluid or mucus in RIGHT maxillary sinus with remaining sinuses and mastoid air cells clear.  CT CERVICAL SPINE FINDINGS  Prevertebral soft tissues normal thickness.  Bones demineralized with probable discogenic sclerosis at inferior C2.  Visualized skullbase intact.  Multilevel disc space narrowing and endplate spur formation throughout cervical spine.  Mild scattered facet degenerative changes.  Vertebral body heights maintained without fracture or subluxation.  Encroachment upon RIGHT C4-C5 and C5-C6 neural foramina by significant uncovertebral spurs.  Lung apices clear.  Atherosclerotic calcifications of the carotid systems bilaterally.  IMPRESSION: Atrophy with small vessel chronic ischemic changes of deep cerebral white matter.  No acute intracranial abnormalities.  Degenerative disc and facet disease changes of the cervical spine as described above.  No  acute cervical spine abnormalities.   Electronically Signed   By: Ulyses Southward M.D.   On: 07/06/2014 10:12   Ct Cervical Spine Wo Contrast  07/06/2014   CLINICAL DATA:  Became dizzy and fell, headache, history asthma, irregular heartbeat, hypertension  EXAM: CT HEAD WITHOUT CONTRAST  CT CERVICAL SPINE WITHOUT CONTRAST  TECHNIQUE: Multidetector CT imaging of the head and cervical spine was performed following the standard protocol without intravenous contrast. Multiplanar CT image reconstructions of the cervical spine were also generated.  COMPARISON:  None  FINDINGS: CT HEAD FINDINGS  Beam hardening artifacts of dental origin.  Normal ventricular morphology.  No midline shift or mass effect.  Mild small vessel chronic ischemic changes of deep cerebral white matter.  No intracranial hemorrhage, mass lesion, or evidence acute infarction.  No extra-axial fluid collections.  Atherosclerotic calcifications at carotid siphons.  Bones demineralized.  Minimal fluid or mucus in RIGHT maxillary sinus with remaining sinuses and mastoid air cells clear.  CT CERVICAL SPINE FINDINGS  Prevertebral soft tissues normal thickness.  Bones demineralized with probable discogenic sclerosis at inferior C2.  Visualized skullbase intact.  Multilevel disc space narrowing and endplate spur formation throughout cervical spine.  Mild scattered facet degenerative changes.  Vertebral body heights maintained without fracture or subluxation.  Encroachment upon RIGHT C4-C5 and C5-C6 neural foramina by significant uncovertebral spurs.  Lung apices clear.  Atherosclerotic calcifications of the carotid systems bilaterally.  IMPRESSION: Atrophy with small vessel chronic ischemic changes of deep cerebral white matter.  No acute intracranial abnormalities.  Degenerative disc and facet disease changes of the cervical spine as described above.  No acute cervical spine abnormalities.   Electronically Signed   By: Ulyses SouthwardMark  Boles M.D.   On: 07/06/2014 10:12    Pelvis Portable  07/07/2014   CLINICAL DATA:  Postop left hip arthroplasty.  EXAM: PORTABLE PELVIS 1-2 VIEWS  COMPARISON:  None.  FINDINGS: Postoperative left hip hemiarthroplasty. Non cemented femoral component appears well seated. No evidence of dislocation. Soft tissue gas around the left hip and groin region is likely due to recent surgery. Pelvis and right hip appear grossly intact.  IMPRESSION: Postoperative left hip hemiarthroplasty. Components appear well seated. No dislocation or fracture. Soft tissue gas consistent with recent surgery   Electronically Signed   By: Burman NievesWilliam  Stevens M.D.   On: 07/07/2014 01:11   Dg Hip Unilat With Pelvis 2-3 Views Left  07/06/2014   CLINICAL DATA:  Larey SeatFell this morning at work, LEFT hip deformity, initial encounter  EXAM: LEFT HIP (WITH PELVIS) 2-3 VIEWS  COMPARISON:  None  FINDINGS: Diffuse osseous demineralization.  Displaced LEFT femoral neck fracture.  No dislocation or additional fractures.  Hip joint spaces appear slightly narrowed bilaterally.  Mild sclerosis and irregularity of the SI joints bilaterally suggesting sacroiliitis.  Degenerative disc and facet disease changes at visualized lower lumbar spine.  IMPRESSION: Displaced LEFT femoral neck fracture.   Electronically Signed   By: Ulyses SouthwardMark  Boles M.D.   On: 07/06/2014 08:45    Scheduled Meds: . antiseptic oral rinse  7 mL Mouth Rinse BID  . aspirin EC  325 mg Oral Q breakfast  . docusate sodium  100 mg Oral BID  . escitalopram  10 mg Oral Daily  . feeding supplement (RESOURCE BREEZE)  1 Container Oral TID BM  . metoprolol  50 mg Oral BID   Continuous Infusions:    Time spent: > 35 minutes    Penny PiaVEGA, Emmamae Mcnamara  [facility] Pager [phone]. If 7PM-7AM, please contact night-coverage at [email or web address], password [email or web address] 07/07/2014, 4:21 PM  LOS: 1 day

## 2014-07-07 NOTE — Evaluation (Signed)
Physical Therapy Evaluation Patient Details Name: Deanna Diaz MRN: [id number] DOB: 1930/07/04 Today's Date: 07/07/2014   History of Present Illness  Pt had bronchitis was on meds, then syncopal episode which resulted in left hip fx followed by left hip hemiarthroplasty on 07/06/14  Clinical Impression  Patient is s/p above surgery resulting in functional limitations due to the deficits listed below (see PT Problem List). Pt lethargic today as well as slightly dizzy which limited mobility to bed to chair transfer with 2 person mod A. Anticipate that pt will progress well once she is more stable. Has 24 hr family support at home.  Patient will benefit from skilled PT to increase their independence and safety with mobility to allow discharge to the venue listed below.       Follow Up Recommendations Home health PT;Supervision/Assistance - 24 hour Expect pt to progress well though she is agreeable to ST-SNF if this does not happen and she continues to need 2 person assist for mobility.    Equipment Recommendations  None recommended by PT    Recommendations for Other Services OT consult     Precautions / Restrictions Precautions Precautions: Posterior Hip Precaution Booklet Issued: Yes (comment) Restrictions Weight Bearing Restrictions: Yes LLE Weight Bearing: Weight bearing as tolerated      Mobility  Bed Mobility Overal bed mobility: Needs Assistance Bed Mobility: Supine to Sit     Supine to sit: Mod assist     General bed mobility comments: pivoted to EOB with mod A to hips to come fwd. Pt very painful with bed mobility  Transfers Overall transfer level: Needs assistance Equipment used: Rolling walker (2 wheeled);2 person hand held assist Transfers: Sit to/from UGI CorporationStand;Stand Pivot Transfers Sit to Stand: Mod assist Stand pivot transfers: +2 physical assistance;Mod assist       General transfer comment: pt able to stand with one person assist but quickly sat back down due  to pain. Attempted transfer with RW and 1 person assist but pt anxious and unable to safely move RW todat. Second time, used 2 person arm in arm assist and pt able to take pivot steps to chair. vc's for sequencing.  Ambulation/Gait             General Gait Details: pt slightly dizzy, BP low, deferred further ambulation  Stairs            Wheelchair Mobility    Modified Rankin (Stroke Patients Only)       Balance Overall balance assessment: Needs assistance Sitting-balance support: No upper extremity supported;Feet supported Sitting balance-Leahy Scale: Good     Standing balance support: Bilateral upper extremity supported;During functional activity Standing balance-Leahy Scale: Poor Standing balance comment: unable to stand without UE support due to pain                             Pertinent Vitals/Pain Pain Assessment: 0-10 Pain Score: 8  Pain Location: left hip Pain Intervention(s): Patient requesting pain meds-RN notified;Monitored during session;Limited activity within patient's tolerance  O2 sats 89% on RA, 2L O2 replaced after session, HR on low 100's with mobility.    Home Living Family/patient expects to be discharged to:: Private residence Living Arrangements: Alone Available Help at Discharge: Family;Available 24 hours/day Type of Home: House Home Access: Ramped entrance     Home Layout: One level Home Equipment: Walker - 2 wheels;Bedside commode;Shower seat;Grab bars - tub/shower;Grab bars - toilet;Hospital bed;Wheelchair - manual Additional Comments:  pt has several family members not currently working that can stay with her 24/7 and has lots of equip from deceased husband. Pt works as Conservation officer, naturecashier at [employer], was there when she had synsopal episode    Prior Function Level of Independence: Independent               Hand Dominance        Extremity/Trunk Assessment   Upper Extremity Assessment: Overall WFL for tasks assessed            Lower Extremity Assessment: LLE deficits/detail   LLE Deficits / Details: ankle appears WFL, quad 2/5, could not be fully tested due to pain  Cervical / Trunk Assessment: Kyphotic  Communication   Communication: No difficulties  Cognition Arousal/Alertness: Lethargic;Suspect due to medications Behavior During Therapy: [facility]FL for tasks assessed/performed;Anxious Overall Cognitive Status: Within Functional Limits for tasks assessed                      General Comments      Exercises Total Joint Exercises Ankle Circles/Pumps: AROM;Both;10 reps;Seated Quad Sets: AROM;Both;10 reps;Seated Gluteal Sets: AROM;Both;10 reps;Seated      Assessment/Plan    PT Assessment Patient needs continued PT services  PT Diagnosis Difficulty walking;Abnormality of gait;Acute pain   PT Problem List Decreased strength;Decreased range of motion;Decreased activity tolerance;Decreased balance;Decreased mobility;Decreased knowledge of use of DME;Decreased safety awareness;Decreased knowledge of precautions;Pain  PT Treatment Interventions DME instruction;Gait training;Functional mobility training;Therapeutic activities;Therapeutic exercise;Balance training;Neuromuscular re-education;Patient/family education   PT Goals (Current goals can be found in the Care Plan section) Acute Rehab PT Goals Patient Stated Goal: return home PT Goal Formulation: With patient Time For Goal Achievement: 07/14/14 Potential to Achieve Goals: Good    Frequency 7X/week   Barriers to discharge        Co-evaluation               End of Session Equipment Utilized During Treatment: Gait belt Activity Tolerance: Patient limited by pain Patient left: in chair;with call bell/phone within reach;with chair alarm set;with family/visitor present Nurse Communication: Mobility status         Time: 0911-0953 PT Time Calculation (min) (ACUTE ONLY): 42 min   Charges:   PT Evaluation $Initial PT  Evaluation Tier I: 1 Procedure PT Treatments $Therapeutic Activity: 23-37 mins   PT G Codes:       Lyanne CoVictoria Maricruz Lucero, PT  Acute Rehab Services  [phone]  Lyanne CoManess, Wendy Mikles 07/07/2014, 10:06 AM

## 2014-07-07 NOTE — Care Management Note (Signed)
Case Management Note  Patient Details  Name: Marc MorgansLaura H Vankirk MRN: [id number] Date of Birth: [birth year]  Subjective/Objective:     Pt admitted on 07/06/14 with syncope with fall and hip fracture.  PTA, pt independent of ADLS; she works at [employer].                Action/Plan: PT/OT recommending SNF for rehab at dc.  Will consult CSW to facilitate poss dc to SNF when medically stable.  Will follow.    Expected Discharge Date:                  Expected Discharge Plan:  Skilled Nursing Facility  In-House Referral:  Clinical Social Work  Discharge planning Services  CM Consult  Post Acute Care Choice:    Choice offered to:     DME Arranged:    DME Agency:     HH Arranged:    HH Agency:     Status of Service:  In process, will continue to follow  Medicare Important Message Given:    Date Medicare IM Given:    Medicare IM give by:    Date Additional Medicare IM Given:    Additional Medicare Important Message give by:     If discussed at Long Length of Stay Meetings, dates discussed:    Additional Comments:  Glennon Macmerson, Adiya Selmer M, RN 07/07/2014, 5:25 PM Phone #[phone]

## 2014-07-07 NOTE — Progress Notes (Signed)
Initial Nutrition Assessment  DOCUMENTATION CODES:  Severe malnutrition in context of acute illness/injury  INTERVENTION:  Boost Breeze, Ensure Enlive (each supplement provides 350kcal and 20 grams of protein)  NUTRITION DIAGNOSIS:  Inadequate oral intake related to acute illness as evidenced by percent weight loss, severe depletion of muscle mass, moderate depletion of body fat.   GOAL:  Patient will meet greater than or equal to 90% of their needs  MONITOR:  PO intake, Supplement acceptance, Diet advancement, Labs, Weight trends  REASON FOR ASSESSMENT:  Consult Hip fracture protocol  ASSESSMENT: 81 year old Caucasian female who presents after a syncopal episode which resulted in fracture of her left hip. She is recovering from an episode of acute bronchitis.  Pt reports that she has no appetite for the past 3 weeks due to bronchitis. Family at bedside states that patient was drinking and eating so little, it likely contributed to her passing out. Pt reports eating poorly prior to getting sick. She reports that she used weigh 135 lbs about 1-2 months ago.  She is currently on clear liquids. She is agreeable to receiving Resource Breeze supplements now and Ensure Enlive when diet is advanced. RD encouraged PO intake.   Labs reviewed.  Height:  Ht Readings from Last 1 Encounters:  07/06/14 5\' 6"  (1.676 m)    Weight:  Wt Readings from Last 1 Encounters:  07/07/14 122 lb 14.4 oz (55.747 kg)    Ideal Body Weight:  59.1 kg  Wt Readings from Last 10 Encounters:  07/07/14 122 lb 14.4 oz (55.747 kg)    BMI:  Body mass index is 19.85 kg/(m^2).  Estimated Nutritional Needs:  Kcal:  1450-1650  Protein:  70-80 grams  Fluid:  1.4-1.6 L/day  Skin:   (Closed incision on left hip)  Diet Order:  Diet clear liquid Room service appropriate?: Yes; Fluid consistency:: Thin  EDUCATION NEEDS:  No education needs identified at this time   Intake/Output Summary (Last  24 hours) at 07/07/14 1310 Last data filed at 07/07/14 1150  Gross per 24 hour  Intake 1765.83 ml  Output   1300 ml  Net 465.83 ml    Last BM:  5/9  Ian Malkineanne Barnett RD, LDN Inpatient Clinical Dietitian Pager: [phone] After Hours Pager: [phone]

## 2014-07-07 NOTE — Progress Notes (Signed)
     Subjective:  POD#1 L Hip hemiarthroplasty. Patient reports pain as moderate.  Resting comfortably in bed.   Objective:   VITALS:   Filed Vitals:   07/06/14 2319 07/06/14 2335 07/07/14 0008 07/07/14 0518  BP:  182/76 142/65 103/45  Pulse:  73 68 81  Temp:  97.8 F (36.6 C)  97.9 F (36.6 C)  TempSrc:  Oral  Oral  Resp:  16  16  Height:      Weight:    55.747 kg (122 lb 14.4 oz)  SpO2: 0% 95% 97% 99%    Neurologically intact ABD soft Neurovascular intact Sensation intact distally Intact pulses distally Dorsiflexion/Plantar flexion intact Incision: dressing C/D/I   Lab Results  Component Value Date   WBC 14.7* 07/07/2014   HGB 12.4 07/07/2014   HCT 37.4 07/07/2014   MCV 85.8 07/07/2014   PLT 179 07/07/2014   BMET    Component Value Date/Time   NA 135 07/07/2014 0536   K 5.0 07/07/2014 0536   CL 103 07/07/2014 0536   CO2 20* 07/07/2014 0536   GLUCOSE 85 07/07/2014 0536   BUN 29* 07/07/2014 0536   CREATININE 1.02* 07/07/2014 0536   CALCIUM 9.5 07/07/2014 0536   GFRNONAA 50* 07/07/2014 0536   GFRAA 58* 07/07/2014 0536     Assessment/Plan: 1 Day Post-Op   Principal Problem:   Syncope Active Problems:   Closed left hip fracture   Essential hypertension   Osteoporosis   Up with therapy WBAT in the LLE Posterior hip precautions ASA 325mg  for DVT prophylaxis  Deanna Diaz 07/07/2014, 8:41 AM Cell [phone]

## 2014-07-07 NOTE — Progress Notes (Signed)
OT Cancellation Note  Patient Details Name: Marc MorgansLaura H Ohlinger MRN: [id number] DOB: 21-May-1930   Cancelled Treatment:    Reason Eval/Treat Not Completed: Medical issues which prohibited therapy - Pt with n/v   Angelene GiovanniConarpe, Fabiha Rougeau M  Yogi Arther Yuk[place]arpe, OTR/L [phone]  07/07/2014, 11:54 AM

## 2014-07-07 NOTE — Significant Event (Signed)
Rapid Response Event Note  Overview:  Called to assess patient who is hypotensive s/p L hip surgery.     Initial Focused Assessment: On arrival patient appears comfortable and is alert and oriented.  Currently receiving 500cc NS bolus.  Surgical site- warm and soft with a dry and intact dressing.  Extremities warm with positive pedal pulses.    Interventions: BP rechecked in bilateral arms.  SBP under 100 and 2nd 500cc bolus started per Dr. Kirby CriglerVega's orders.    Event Summary:  Bedside RN to recheck BP after second 500cc bolus and report findings to MD and RRT.        Deanna Diaz, Deanna Diaz

## 2014-07-08 DIAGNOSIS — I959 Hypotension, unspecified: Secondary | ICD-10-CM

## 2014-07-08 MED ORDER — ENSURE ENLIVE PO LIQD
237.0000 mL | Freq: Two times a day (BID) | ORAL | Status: DC
  Administered 2014-07-08 – 2014-07-09 (×3): 237 mL via ORAL

## 2014-07-08 MED ORDER — ASPIRIN EC 325 MG PO TBEC
325.0000 mg | DELAYED_RELEASE_TABLET | Freq: Every day | ORAL | Status: DC
  Administered 2014-07-08 – 2014-07-09 (×2): 325 mg via ORAL
  Filled 2014-07-08 (×3): qty 1

## 2014-07-08 NOTE — Evaluation (Signed)
Occupational Therapy Evaluation Patient Details Name: Deanna MorgansLaura H Salle MRN: [id number] DOB: Oct 09, 1930 Today's Date: 07/08/2014    History of Present Illness Pt had bronchitis was on meds, then syncopal episode which resulted in left hip fx followed by left hip hemiarthroplasty on 07/06/14. PMH of HTN and osteoporosis   Clinical Impression   Pt was active and independent prior to admission.  Present with L hip pain, generalized weakness, impaired balance and decrease knowledge of hip precautions.  Pt requires +2 assist for mobility.  Will need post acute rehab prior to return home with her supportive family.    Follow Up Recommendations  SNF;Supervision/Assistance - 24 hour    Equipment Recommendations       Recommendations for Other Services       Precautions / Restrictions Precautions Precautions: Posterior Hip;Fall Precaution Comments: educated for precautions Restrictions Weight Bearing Restrictions: No LLE Weight Bearing: Weight bearing as tolerated      Mobility Bed Mobility Overal bed mobility: Needs Assistance Bed Mobility: Sit to Supine      Sit to supine: Max assist   General bed mobility comments: assist to guide shoulders and for LEs up in bed, +2 assist to pull up in bed  Transfers Overall transfer level: Needs assistance Equipment used: 2 person hand held assist Transfers: Sit to/from Stand;Stand Pivot Transfers Sit to Stand: +2 physical assistance;Mod assist Stand pivot transfers: +2 physical assistance;Mod assist       General transfer comment: cues for hand placement and technique    Balance Overall balance assessment: Needs assistance Sitting-balance support: Feet supported Sitting balance-Leahy Scale: Good       Standing balance-Leahy Scale: Poor                              ADL Overall ADL's : Needs assistance/impaired Eating/Feeding: Independent;Sitting   Grooming: Set up;Sitting   Upper Body Bathing: Minimal  assitance;Sitting   Lower Body Bathing: +2 for physical assistance;Total assistance   Upper Body Dressing : Minimal assistance;Sitting   Lower Body Dressing: +2 for physical assistance;Total assistance;Sit to/from stand   Toilet Transfer: +2 for physical assistance;Moderate assistance   Toileting- Clothing Manipulation and Hygiene: +2 for safety/equipment;Total assistance;Sit to/from stand         General ADL Comments: Instructed in hip precautions related to ADL and to keep pillow between knees with rolling for bed pan use.     Vision     Perception     Praxis      Pertinent Vitals/Pain Pain Assessment: 0-10 Pain Score: 6  Pain Location: L hip Pain Descriptors / Indicators: Aching Pain Intervention(s): Limited activity within patient's tolerance;Monitored during session;Repositioned;Ice applied     Hand Dominance Right   Extremity/Trunk Assessment Upper Extremity Assessment Upper Extremity Assessment: Overall WFL for tasks assessed   Lower Extremity Assessment Lower Extremity Assessment: Defer to PT evaluation   Cervical / Trunk Assessment Cervical / Trunk Assessment: Kyphotic   Communication Communication Communication: No difficulties   Cognition Arousal/Alertness: Awake/alert Behavior During Therapy: WFL for tasks assessed/performed Overall Cognitive Status: Within Functional Limits for tasks assessed       Memory: Decreased recall of precautions             General Comments       Exercises       Shoulder Instructions      Home Living Family/patient expects to be discharged to:: Private residence Living Arrangements: Alone Available Help at Discharge: Family;Available  24 hours/day Type of Home: House Home Access: Ramped entrance     Home Layout: One level     Bathroom Shower/Tub: Producer, television/film/videoWalk-in shower   Bathroom Toilet: Handicapped height     Home Equipment: Environmental consultantWalker - 2 wheels;Bedside commode;Shower seat;Grab bars - tub/shower;Grab bars  - toilet;Hospital bed;Wheelchair - manual   Additional Comments: pt has several family members not currently working that can stay with her 24/7 and has lots of equip from deceased husband. Pt works as Conservation officer, naturecashier at [employer], was there when she had synsopal episode      Prior Functioning/Environment Level of Independence: Independent             OT Diagnosis: Generalized weakness;Acute pain   OT Problem List: Decreased strength;Decreased activity tolerance;Impaired balance (sitting and/or standing);Decreased knowledge of use of DME or AE;Decreased knowledge of precautions;Pain   OT Treatment/Interventions: Self-care/ADL training;DME and/or AE instruction;Therapeutic activities;Patient/family education    OT Goals(Current goals can be found in the care plan section) Acute Rehab OT Goals Patient Stated Goal: agreeable to SNF for ST rehab, learn to walk OT Goal Formulation: With patient Time For Goal Achievement: 07/15/14 Potential to Achieve Goals: Good ADL Goals Pt Will Perform Grooming: with min assist;standing Pt Will Transfer to Toilet: with min assist;ambulating;bedside commode Pt Will Perform Toileting - Clothing Manipulation and hygiene: with min assist;sit to/from stand Additional ADL Goal #1: Pt will state 3/3 hip precautions. Additional ADL Goal #2: Pt will perform bed mobility with min assist in preparation for ADL at EOB.  OT Frequency: Min 2X/week   Barriers to D/C:            Co-evaluation              End of Session Equipment Utilized During Treatment: Gait belt;Oxygen Nurse Communication: Mobility status (use of pillow between knees for rolling)  Activity Tolerance: Patient limited by pain Patient left: in bed;with call bell/phone within reach;with family/visitor present (abduction pillow applied)   Time: 1140-1210 OT Time Calculation (min): 30 min Charges:  OT General Charges $OT Visit: 1 Procedure OT Evaluation $Initial OT Evaluation Tier I: 1  Procedure OT Treatments $Self Care/Home Management : 8-22 mins G-Codes:    Evern BioMayberry, Chares Slaymaker Lynn 07/08/2014, 12:20 PM  [phone]

## 2014-07-08 NOTE — Progress Notes (Signed)
[facility] PROGRESS NOTE  Marc MorgansLaura H Trovato ZOX:[id number] DOB: 03/12/1930 DOA: 07/06/2014 PCP: Ailene RavelHAMRICK,MAURA L, MD  Assessment/Plan:  Principal Problem:   Syncope - Most likely secondary to dehydration, resolved after IV fluid rehydration - Unable to obtain orthostatics given recent left hip fracture - We'll encourage oral intake and saline lock patient - No fevers and leukocytosis in setting of recent closed left hip fracture status post left hip hemiarthroplasty - Chest x-ray does not report any infiltrates or consolidation -Urinalysis negative - Perioperatively patient was on cefazolin  Hypotension - No active bleeding and most likely related to dehydration.  - Diet liberalized and maintenance IV fluids discontinued given improvement in blood pressures  Active Problems:   Closed left hip fracture -Orthopedic surgeries on board and assisting with management - Continue supportive therapy.  - PT evaluation    Essential hypertension -Patient is on metoprolol at home. We'll continue to hold beta blocker given recent hypotension    Protein-calorie malnutrition, severe -Will liberalize diet to heart healthy diet  Code Status: full Family Communication: No family at bedside Disposition Plan: Pending improvement in condition.   Consultants:  Orthopaedic surgery: Last seen by Barrie FolkBrittney Kelley  Procedures: POD#2 L Hip hemiarthroplasty  Antibiotics:  None  HPI/Subjective: Pt patient has no New complaints and feels better  Objective: Filed Vitals:   07/08/14 1335  BP: 129/39  Pulse: 113  Temp: 98.2 F (36.8 C)  Resp: 18    Intake/Output Summary (Last 24 hours) at 07/08/14 1712 Last data filed at 07/08/14 1442  Gross per 24 hour  Intake   2094 ml  Output   1300 ml  Net    794 ml   Filed Weights   07/06/14 1348 07/07/14 0518 07/08/14 0625  Weight: 53.4 kg (117 lb 11.6 oz) 55.747 kg (122 lb 14.4 oz) 61.689 kg (136 lb)    Exam:   General:  Patient in no  acute distress, alert and awake, oriented 3  Cardiovascular: Regular rate and rhythm, no rubs  Respiratory: Clear to auscultation bilaterally, no wheezes  Abdomen: Soft, nondistended, nontender  Musculoskeletal: Normal tone bilaterally   Data Reviewed: Basic Metabolic Panel:  Recent Labs Lab 07/06/14 0755 07/07/14 0536  NA 138 135  K 4.1 5.0  CL 103 103  CO2 25 20*  GLUCOSE 95 85  BUN 44* 29*  CREATININE 1.39* 1.02*  CALCIUM 10.9* 9.5   Liver Function Tests:  Recent Labs Lab 07/06/14 0755  AST 22  ALT 16  ALKPHOS 52  BILITOT 0.8  PROT 6.1*  ALBUMIN 3.3*   No results for input(s): LIPASE, AMYLASE in the last 168 hours. No results for input(s): AMMONIA in the last 168 hours. CBC:  Recent Labs Lab 07/06/14 0755 07/07/14 0536  WBC 14.4* 14.7*  NEUTROABS 9.8*  --   HGB 12.4 12.4  HCT 37.8 37.4  MCV 86.5 85.8  PLT 262 179   Cardiac Enzymes:  Recent Labs Lab 07/06/14 1340  TROPONINI <0.03   BNP (last 3 results) No results for input(s): BNP in the last 8760 hours.  ProBNP (last 3 results) No results for input(s): PROBNP in the last 8760 hours.  CBG: No results for input(s): GLUCAP in the last 168 hours.  Recent Results (from the past 240 hour(s))  Surgical pcr screen     Status: None   Collection Time: 07/06/14  4:42 PM  Result Value Ref Range Status   MRSA, PCR NEGATIVE NEGATIVE Final   Staphylococcus aureus NEGATIVE NEGATIVE Final  Comment:        The Xpert SA Assay (FDA approved for NASAL specimens in patients over 79 years of age), is one component of a comprehensive surveillance program.  Test performance has been validated by [facility] for patients greater than or equal to 377 year old. It is not intended to diagnose infection nor to guide or monitor treatment.      Studies: Pelvis Portable  07/07/2014   CLINICAL DATA:  Postop left hip arthroplasty.  EXAM: PORTABLE PELVIS 1-2 VIEWS  COMPARISON:  None.  FINDINGS:  Postoperative left hip hemiarthroplasty. Non cemented femoral component appears well seated. No evidence of dislocation. Soft tissue gas around the left hip and groin region is likely due to recent surgery. Pelvis and right hip appear grossly intact.  IMPRESSION: Postoperative left hip hemiarthroplasty. Components appear well seated. No dislocation or fracture. Soft tissue gas consistent with recent surgery   Electronically Signed   By: Burman NievesWilliam  Stevens M.D.   On: 07/07/2014 01:11    Scheduled Meds: . antiseptic oral rinse  7 mL Mouth Rinse BID  . aspirin EC  325 mg Oral Q breakfast  . docusate sodium  100 mg Oral BID  . escitalopram  10 mg Oral Daily  . feeding supplement (ENSURE ENLIVE)  237 mL Oral BID BM   Continuous Infusions: . sodium chloride 100 mL/hr at 07/08/14 1052     Time spent: > 35 minutes    Penny PiaVEGA, Haneefah Venturini  [facility] Pager [phone]. If 7PM-7AM, please contact night-coverage at [email or web address], password [email or web address] 07/08/2014, 5:12 PM  LOS: 2 days

## 2014-07-08 NOTE — Progress Notes (Signed)
Physical Therapy Treatment Patient Details Name: Marc MorgansLaura H Harn MRN: [id number] DOB: May 17, 1930 Today's Date: 07/08/2014    History of Present Illness Pt had bronchitis was on meds, then syncopal episode which resulted in left hip fx followed by left hip hemiarthroplasty on 07/06/14. PMH of HTN and osteoporosis    PT Comments    Pt with increased mobility today able to take a few steps. Pt would prefer return home but at this point just not mobile enough for home and highly recommend ST-SNF with dgtr present and agreeable. Pt educated for precautions, HEP, mobility, transfers, use of abductor pillow and incentive spirometer. Pt with sats 86-91% on RA with activity with sats 96% on 2L. HR 113 with activity. Supine BP 117/38 (58), seated BP 117/62 (74)  Follow Up Recommendations  SNF;Supervision/Assistance - 24 hour     Equipment Recommendations       Recommendations for Other Services       Precautions / Restrictions Precautions Precautions: Posterior Hip;Fall Precaution Comments: educated for precautions Restrictions Weight Bearing Restrictions: No LLE Weight Bearing: Weight bearing as tolerated    Mobility  Bed Mobility Overal bed mobility: Needs Assistance Bed Mobility: Supine to Sit     Supine to sit: Max assist     General bed mobility comments: Max assist to pivot pelvis to EOB, maintaining precautions and supporting LLE with assist to elevate trunk  Transfers Overall transfer level: Needs assistance   Transfers: Sit to/from Stand Sit to Stand: Mod assist         General transfer comment: cues for hand placement, left foot placement, elevated bed height to stand with assist to rise from surface, min assist to sit.   Ambulation/Gait Ambulation/Gait assistance: Min assist;+2 safety/equipment Ambulation Distance (Feet): 6 Feet Assistive device: Rolling walker (2 wheeled) Gait Pattern/deviations: Step-to pattern;Trunk flexed;Narrow base of support   Gait  velocity interpretation: Below normal speed for age/gender General Gait Details: initial assist to advance LLE, cues for sequence with assist to advance RW and chair to follow   Stairs            Wheelchair Mobility    Modified Rankin (Stroke Patients Only)       Balance                                    Cognition Arousal/Alertness: Awake/alert Behavior During Therapy: WFL for tasks assessed/performed Overall Cognitive Status: Within Functional Limits for tasks assessed       Memory: Decreased recall of precautions              Exercises General Exercises - Lower Extremity Ankle Circles/Pumps: AROM;Left;10 reps;Seated Heel Slides: AAROM;Left;10 reps;Seated Hip ABduction/ADduction: AAROM;Left;10 reps;Seated    General Comments        Pertinent Vitals/Pain Pain Score: 6  Pain Location: left hip with movement Pain Intervention(s): Limited activity within patient's tolerance;Premedicated before session;Repositioned    Home Living                      Prior Function            PT Goals (current goals can now be found in the care plan section) Progress towards PT goals: Progressing toward goals    Frequency       PT Plan Discharge plan needs to be updated    Co-evaluation  End of Session Equipment Utilized During Treatment: Gait belt Activity Tolerance: Patient limited by pain Patient left: in chair;with call bell/phone within reach;with chair alarm set;with family/visitor present     Time: [id number] PT Time Calculation (min) (ACUTE ONLY): 30 min  Charges:  $Gait Training: 8-22 mins $Therapeutic Activity: 8-22 mins                    G Codes:      Delorse Lekabor, Pyper Olexa Beth 07/08/2014, 10:34 AM Delaney MeigsMaija Tabor Bray Vickerman, PT [phone]

## 2014-07-08 NOTE — Progress Notes (Signed)
     Subjective:  POD#2 L hip hemiarthroplasty. Patient reports pain as moderate.  Resting comfortably in bed.   Objective:   VITALS:   Filed Vitals:   07/07/14 2158 07/07/14 2355 07/08/14 0409 07/08/14 0625  BP: 99/36 107/39 112/45 107/41  Pulse:    97  Temp:    98.2 F (36.8 C)  TempSrc:    Oral  Resp:    18  Height:      Weight:    61.689 kg (136 lb)  SpO2:    97%    Neurologically intact ABD soft Neurovascular intact Sensation intact distally Intact pulses distally Dorsiflexion/Plantar flexion intact Incision: dressing C/D/I   Lab Results  Component Value Date   WBC 14.7* 07/07/2014   HGB 12.4 07/07/2014   HCT 37.4 07/07/2014   MCV 85.8 07/07/2014   PLT 179 07/07/2014   BMET    Component Value Date/Time   NA 135 07/07/2014 0536   K 5.0 07/07/2014 0536   CL 103 07/07/2014 0536   CO2 20* 07/07/2014 0536   GLUCOSE 85 07/07/2014 0536   BUN 29* 07/07/2014 0536   CREATININE 1.02* 07/07/2014 0536   CALCIUM 9.5 07/07/2014 0536   GFRNONAA 50* 07/07/2014 0536   GFRAA 58* 07/07/2014 0536     Assessment/Plan: 2 Days Post-Op   Principal Problem:   Syncope Active Problems:   Closed left hip fracture   Essential hypertension   Osteoporosis   Protein-calorie malnutrition, severe   Up with therapy WBAT in the LLE Posterior hip precautions ASA 325mg  daily for DVT prophylaxis   Deanna Diaz Marie 07/08/2014, 7:41 AM Cell [phone]

## 2014-07-08 NOTE — Progress Notes (Signed)
Pt states family is sleeping at this time, pt A/Ox4 at shift change report, pt in NAD, will do head to toe assessment at a later time per family.

## 2014-07-09 LAB — CBC
HCT: 29.2 % — ABNORMAL LOW (ref 36.0–46.0)
Hemoglobin: 9.5 g/dL — ABNORMAL LOW (ref 12.0–15.0)
MCH: 28.5 pg (ref 26.0–34.0)
MCHC: 32.5 g/dL (ref 30.0–36.0)
MCV: 87.7 fL (ref 78.0–100.0)
PLATELETS: 148 10*3/uL — AB (ref 150–400)
RBC: 3.33 MIL/uL — AB (ref 3.87–5.11)
RDW: 13.3 % (ref 11.5–15.5)
WBC: 14.9 10*3/uL — AB (ref 4.0–10.5)

## 2014-07-09 MED ORDER — METOPROLOL TARTRATE 12.5 MG HALF TABLET
12.5000 mg | ORAL_TABLET | Freq: Two times a day (BID) | ORAL | Status: DC
  Administered 2014-07-09 (×2): 12.5 mg via ORAL
  Filled 2014-07-09 (×3): qty 1

## 2014-07-09 MED ORDER — HYDROCODONE-ACETAMINOPHEN 5-325 MG PO TABS: 1.0000 | ORAL_TABLET | ORAL | Status: DC | PRN

## 2014-07-09 MED ORDER — METOPROLOL TARTRATE 25 MG PO TABS: 12.5000 mg | ORAL_TABLET | Freq: Two times a day (BID) | ORAL | Status: DC

## 2014-07-09 MED ORDER — ENSURE ENLIVE PO LIQD: 237.0000 mL | Freq: Two times a day (BID) | ORAL | Status: DC

## 2014-07-09 NOTE — Discharge Summary (Signed)
Physician Discharge Summary  Marc MorgansLaura H Laprise ZOX:[id number] DOB: 09-18-1930 DOA: 07/06/2014  PCP: Ailene RavelHAMRICK,MAURA L, MD  Admit date: 07/06/2014 Discharge date: 07/09/2014  Time spent: > 35 minutes  Recommendations for Outpatient Follow-up:  1. Patient will need continued physical therapy at facility 2. Reassess CBC within the next week  Discharge Diagnoses:  Principal Problem:   Syncope Active Problems:   Closed left hip fracture   Essential hypertension   Osteoporosis   Protein-calorie malnutrition, severe   Discharge Condition: stable  Diet recommendation: heart healthy  Filed Weights   07/07/14 0518 07/08/14 0625 07/09/14 0545  Weight: 55.747 kg (122 lb 14.4 oz) 61.689 kg (136 lb) 60.51 kg (133 lb 6.4 oz)    History of present illness:  From original HPI: 81 y.o. female with a past medical history of hypertension, osteoporosis who is fairly active and works as a Conservation officer, naturecashier in [employer]. She tells me that she last week she dealt with acute bronchitis. She was prescribed antibiotics and steroids by her primary care physician. She is recovering from that illness and then today was her first day back to work. She was working and then bent over to pick up something and felt dizzy and passed out.  Hospital Course:  Syncope - Most likely secondary to dehydration, resolved after IV fluid rehydration and improved oral intake. - Unable to obtain orthostatics given recent left hip fracture - No fevers and leukocytosis in setting of recent closed left hip fracture status post left hip hemiarthroplasty - Chest x-ray does not report any infiltrates or consolidation -Urinalysis negative - Perioperatively patient was on cefazolin  Hypotension - No active bleeding and most likely related to dehydration.  - Resolved continue nutritional supplement  Anemia - Patient most likely hemoconcentrated in context of dehydration. Suspect decrease in hemoglobin levels secondary to IV fluid rehydration  and recent operation. We'll recommend patient have CBC reassessed within the next week  Active Problems:  Closed left hip fracture -Orthopedic surgeries on board and assisting with management - Continue supportive therapy.  - PT evaluation   Essential hypertension -Blood pressures became elevated again and patient placed on lower dose beta blocker will discharge on this regimen   Protein-calorie malnutrition, severe -Will liberalize diet to heart healthy diet  Procedures: L Hip hemiarthroplasty  Consultations:  Orthopaedic surgery: Last seen by Barrie FolkBrittney Kelley  Discharge Exam: Filed Vitals:   07/09/14 1048  BP: 130/52  Pulse: 88  Temp:   Resp:     General: Pt in nad, alert and awake Cardiovascular: rrr, no mrg Respiratory: cta bl, no wheezes  Discharge Instructions   Discharge Instructions    Call MD for:  redness, tenderness, or signs of infection (pain, swelling, redness, odor or green/yellow discharge around incision site)    Complete by:  As directed      Call MD for:  severe uncontrolled pain    Complete by:  As directed      Call MD for:  temperature >100.4    Complete by:  As directed      Diet - low sodium heart healthy    Complete by:  As directed      Discharge instructions    Complete by:  As directed   Please call the orthopedic surgeon's office for recommendations on when to follow-up after discharge from hospital.     Increase activity slowly    Complete by:  As directed      Posterior total hip precautions    Complete by:  As directed      Weight bearing as tolerated    Complete by:  As directed           Current Discharge Medication List    START taking these medications   Details  aspirin EC 325 MG tablet Take 1 tablet (325 mg total) by mouth daily. Qty: 30 tablet, Refills: 0    docusate sodium (COLACE) 100 MG capsule Take 1 capsule (100 mg total) by mouth 2 (two) times daily. Continue this while taking narcotics to help with bowel  movements Qty: 30 capsule, Refills: 1    feeding supplement, ENSURE ENLIVE, (ENSURE ENLIVE) LIQD Take 237 mLs by mouth 2 (two) times daily between meals. Qty: 237 mL, Refills: 12    HYDROcodone-acetaminophen (NORCO) 5-325 MG per tablet Take 1-2 tablets by mouth every 4 (four) hours as needed for moderate pain. Qty: 90 tablet, Refills: 0      CONTINUE these medications which have CHANGED   Details  metoprolol (LOPRESSOR) 25 MG tablet Take 0.5 tablets (12.5 mg total) by mouth 2 (two) times daily. Qty: 30 tablet, Refills: 0      CONTINUE these medications which have NOT CHANGED   Details  acetaminophen (TYLENOL) 500 MG tablet Take 500 mg by mouth every 6 (six) hours as needed for mild pain or moderate pain.    alendronate (FOSAMAX) 70 MG tablet Take 70 mg by mouth every 7 (seven) days. thursdays    escitalopram (LEXAPRO) 10 MG tablet Take 10 mg by mouth daily.    hydroxypropyl methylcellulose / hypromellose (ISOPTO TEARS / GONIOVISC) 2.5 % ophthalmic solution Place 1 drop into both eyes as needed for dry eyes.      STOP taking these medications     lisinopril-hydrochlorothiazide (PRINZIDE,ZESTORETIC) 20-25 MG per tablet        Allergies  Allergen Reactions  . Dilaudid [Hydromorphone] Nausea And Vomiting  . Hydrocodone Nausea And Vomiting  . Oxycodone Nausea And Vomiting   Follow-up Information    Follow up with MURPHY, TIMOTHY D, MD In 1 week.   Specialty:  Orthopedic Surgery   Contact information:   [address] [place] [id number] [phone]        The results of significant diagnostics from this hospitalization (including imaging, microbiology, ancillary and laboratory) are listed below for reference.    Significant Diagnostic Studies: Dg Chest 1 View  07/06/2014   CLINICAL DATA:  Syncope, fell this morning at work, LEFT hip injury, history asthma, hypertension  EXAM: CHEST  1 VIEW  COMPARISON:  03/05/2007  FINDINGS: Mild enlargement of  cardiac silhouette.  Atherosclerotic calcification aorta.  Mediastinal contours and pulmonary vascularity normal.  Mild bronchitic changes.  Skin fold projects over RIGHT upper lobe.  No definite infiltrate, pleural effusion or pneumothorax.  Osseous demineralization.  IMPRESSION: Mild enlargement of cardiac silhouette.  Bronchitic changes.   Electronically Signed   By: Ulyses SouthwardMark  Boles M.D.   On: 07/06/2014 08:47   Ct Head Wo Contrast  07/06/2014   CLINICAL DATA:  Became dizzy and fell, headache, history asthma, irregular heartbeat, hypertension  EXAM: CT HEAD WITHOUT CONTRAST  CT CERVICAL SPINE WITHOUT CONTRAST  TECHNIQUE: Multidetector CT imaging of the head and cervical spine was performed following the standard protocol without intravenous contrast. Multiplanar CT image reconstructions of the cervical spine were also generated.  COMPARISON:  None  FINDINGS: CT HEAD FINDINGS  Beam hardening artifacts of dental origin.  Normal ventricular morphology.  No midline shift or mass  effect.  Mild small vessel chronic ischemic changes of deep cerebral white matter.  No intracranial hemorrhage, mass lesion, or evidence acute infarction.  No extra-axial fluid collections.  Atherosclerotic calcifications at carotid siphons.  Bones demineralized.  Minimal fluid or mucus in RIGHT maxillary sinus with remaining sinuses and mastoid air cells clear.  CT CERVICAL SPINE FINDINGS  Prevertebral soft tissues normal thickness.  Bones demineralized with probable discogenic sclerosis at inferior C2.  Visualized skullbase intact.  Multilevel disc space narrowing and endplate spur formation throughout cervical spine.  Mild scattered facet degenerative changes.  Vertebral body heights maintained without fracture or subluxation.  Encroachment upon RIGHT C4-C5 and C5-C6 neural foramina by significant uncovertebral spurs.  Lung apices clear.  Atherosclerotic calcifications of the carotid systems bilaterally.  IMPRESSION: Atrophy with small  vessel chronic ischemic changes of deep cerebral white matter.  No acute intracranial abnormalities.  Degenerative disc and facet disease changes of the cervical spine as described above.  No acute cervical spine abnormalities.   Electronically Signed   By: Ulyses Southward M.D.   On: 07/06/2014 10:12   Ct Cervical Spine Wo Contrast  07/06/2014   CLINICAL DATA:  Became dizzy and fell, headache, history asthma, irregular heartbeat, hypertension  EXAM: CT HEAD WITHOUT CONTRAST  CT CERVICAL SPINE WITHOUT CONTRAST  TECHNIQUE: Multidetector CT imaging of the head and cervical spine was performed following the standard protocol without intravenous contrast. Multiplanar CT image reconstructions of the cervical spine were also generated.  COMPARISON:  None  FINDINGS: CT HEAD FINDINGS  Beam hardening artifacts of dental origin.  Normal ventricular morphology.  No midline shift or mass effect.  Mild small vessel chronic ischemic changes of deep cerebral white matter.  No intracranial hemorrhage, mass lesion, or evidence acute infarction.  No extra-axial fluid collections.  Atherosclerotic calcifications at carotid siphons.  Bones demineralized.  Minimal fluid or mucus in RIGHT maxillary sinus with remaining sinuses and mastoid air cells clear.  CT CERVICAL SPINE FINDINGS  Prevertebral soft tissues normal thickness.  Bones demineralized with probable discogenic sclerosis at inferior C2.  Visualized skullbase intact.  Multilevel disc space narrowing and endplate spur formation throughout cervical spine.  Mild scattered facet degenerative changes.  Vertebral body heights maintained without fracture or subluxation.  Encroachment upon RIGHT C4-C5 and C5-C6 neural foramina by significant uncovertebral spurs.  Lung apices clear.  Atherosclerotic calcifications of the carotid systems bilaterally.  IMPRESSION: Atrophy with small vessel chronic ischemic changes of deep cerebral white matter.  No acute intracranial abnormalities.   Degenerative disc and facet disease changes of the cervical spine as described above.  No acute cervical spine abnormalities.   Electronically Signed   By: Ulyses Southward M.D.   On: 07/06/2014 10:12   Pelvis Portable  07/07/2014   CLINICAL DATA:  Postop left hip arthroplasty.  EXAM: PORTABLE PELVIS 1-2 VIEWS  COMPARISON:  None.  FINDINGS: Postoperative left hip hemiarthroplasty. Non cemented femoral component appears well seated. No evidence of dislocation. Soft tissue gas around the left hip and groin region is likely due to recent surgery. Pelvis and right hip appear grossly intact.  IMPRESSION: Postoperative left hip hemiarthroplasty. Components appear well seated. No dislocation or fracture. Soft tissue gas consistent with recent surgery   Electronically Signed   By: Burman Nieves M.D.   On: 07/07/2014 01:11   Dg Hip Unilat With Pelvis 2-3 Views Left  07/06/2014   CLINICAL DATA:  Larey Seat this morning at work, LEFT hip deformity, initial encounter  EXAM: LEFT HIP (  WITH PELVIS) 2-3 VIEWS  COMPARISON:  None  FINDINGS: Diffuse osseous demineralization.  Displaced LEFT femoral neck fracture.  No dislocation or additional fractures.  Hip joint spaces appear slightly narrowed bilaterally.  Mild sclerosis and irregularity of the SI joints bilaterally suggesting sacroiliitis.  Degenerative disc and facet disease changes at visualized lower lumbar spine.  IMPRESSION: Displaced LEFT femoral neck fracture.   Electronically Signed   By: Ulyses Southward M.D.   On: 07/06/2014 08:45    Microbiology: Recent Results (from the past 240 hour(s))  Surgical pcr screen     Status: None   Collection Time: 07/06/14  4:42 PM  Result Value Ref Range Status   MRSA, PCR NEGATIVE NEGATIVE Final   Staphylococcus aureus NEGATIVE NEGATIVE Final    Comment:        The Xpert SA Assay (FDA approved for NASAL specimens in patients over 45 years of age), is one component of a comprehensive surveillance program.  Test performance  has been validated by [facility] for patients greater than or equal to 24 year old. It is not intended to diagnose infection nor to guide or monitor treatment.      Labs: Basic Metabolic Panel:  Recent Labs Lab 07/06/14 0755 07/07/14 0536  NA 138 135  K 4.1 5.0  CL 103 103  CO2 25 20*  GLUCOSE 95 85  BUN 44* 29*  CREATININE 1.39* 1.02*  CALCIUM 10.9* 9.5   Liver Function Tests:  Recent Labs Lab 07/06/14 0755  AST 22  ALT 16  ALKPHOS 52  BILITOT 0.8  PROT 6.1*  ALBUMIN 3.3*   No results for input(s): LIPASE, AMYLASE in the last 168 hours. No results for input(s): AMMONIA in the last 168 hours. CBC:  Recent Labs Lab 07/06/14 0755 07/07/14 0536 07/09/14 0345  WBC 14.4* 14.7* 14.9*  NEUTROABS 9.8*  --   --   HGB 12.4 12.4 9.5*  HCT 37.8 37.4 29.2*  MCV 86.5 85.8 87.7  PLT 262 179 148*   Cardiac Enzymes:  Recent Labs Lab 07/06/14 1340  TROPONINI <0.03   BNP: BNP (last 3 results) No results for input(s): BNP in the last 8760 hours.  ProBNP (last 3 results) No results for input(s): PROBNP in the last 8760 hours.  CBG: No results for input(s): GLUCAP in the last 168 hours.     Signed:  Penny Pia  [facility] 07/09/2014, 11:32 AM

## 2014-07-09 NOTE — Progress Notes (Signed)
     Subjective:  POD#3 L hip hemiarthroplasty. Patient reports pain as moderate.  Resting comfortably in bed.  Objective:   VITALS:   Filed Vitals:   07/08/14 2339 07/09/14 0204 07/09/14 0545 07/09/14 1048  BP: 145/63 127/56 126/51 130/52  Pulse: 118 120 91 88  Temp:   97.5 F (36.4 C)   TempSrc:   Oral   Resp:   18   Height:      Weight:   60.51 kg (133 lb 6.4 oz)   SpO2:   97%     Neurologically intact ABD soft Neurovascular intact Sensation intact distally Intact pulses distally Dorsiflexion/Plantar flexion intact Incision: dressing C/D/I   Lab Results  Component Value Date   WBC 14.9* 07/09/2014   HGB 9.5* 07/09/2014   HCT 29.2* 07/09/2014   MCV 87.7 07/09/2014   PLT 148* 07/09/2014   BMET    Component Value Date/Time   NA 135 07/07/2014 0536   K 5.0 07/07/2014 0536   CL 103 07/07/2014 0536   CO2 20* 07/07/2014 0536   GLUCOSE 85 07/07/2014 0536   BUN 29* 07/07/2014 0536   CREATININE 1.02* 07/07/2014 0536   CALCIUM 9.5 07/07/2014 0536   GFRNONAA 50* 07/07/2014 0536   GFRAA 58* 07/07/2014 0536     Assessment/Plan: 3 Days Post-Op   Principal Problem:   Syncope Active Problems:   Closed left hip fracture   Essential hypertension   Osteoporosis   Protein-calorie malnutrition, severe   Up with therapy WBAT in the LLE Posterior hip precautions Ok form ortho standpoint for discharge once stable from medicine standpoint (daughter thinks that she may go home today if bed is available)    Deanna Diaz Deanna Diaz 07/09/2014, 12:30 PM Cell [phone]

## 2014-07-09 NOTE — Progress Notes (Signed)
Pt HR 115-125 at rest, up to 130-140s sinus tach when using bedpan or turning in bed. Pt states she does not need anything for pain at this time. BP tonight 131/47 and 145/63. Pt lopressor d/c yesterday d/t hypotension. K Kirby notified of elevated HR. Pt asymptomatic at this time. New order for lopressor PO BID Huel Coventryosenberger, Lilac Hoff A, RN

## 2014-07-09 NOTE — Clinical Social Work Note (Signed)
Clinical Social Work Assessment  Patient Details  Name: Deanna Diaz MRN: [id number] Date of Birth: [birth year]  Date of referral:  07/08/14               Reason for consult:  Facility Placement                Permission sought to share information with:  Facility Medical sales representativeContact Representative, Family Supports Permission granted to share information::  Yes, Verbal Permission Granted  Name::     Area [facility],  daughters- Deanna RoundsSally and Deanna ElmSylvia         Housing/Transportation Living arrangements for the past 2 months:  Single Family Home Source of Information:  Patient, Adult Children Patient Interpreter Needed:  None Criminal Activity/Legal Involvement Pertinent to Current Situation/Hospitalization:  No - Comment as needed Significant Relationships:  Adult Children Lives with:  Self Do you feel safe going back to the place where you live?  No (Not until after rehab is completed- then will return home alone and to independent status) Need for family participation in patient care:  Yes (Comment)  Care giving concerns:  Patient lived alone and was fully independent of her ADL;s. Now needs rehab in SNF or recovery period. Daughter at bedside was surprised that MD stated patient is ready for d/c today. She expected her mother to require at least 1 more day of hospitalization.    Social Worker assessment / plan:  Very pleasant 81 year old female admitted following a fall at work when she became dizzy and passed out.  She states that she is very active and works full time, still Set designerdrives etc.  Lives alone and is very proud that she has been able to remain independent. Patient and her daughter Deanna RoundsSally are accepting of need for short term SNF. Family stated preference for [facility] if possible. This facility now has a Community education officercontract with [employer] and [employer]. [facility]. [facility] wants to offer for patient but cannot accept without BCBS authorization  which can take up to 72 business hours.  Patient is medically stable today. CSW discussed this issue with patient and daughters and Letter of Guarantee bed process was discussed in detail.  Bed offer received [facility] to take patient until [facility] could obtain insurance auth (initiated today).  A 7 day Letter of Guarantee was provided to [facility] per ok of Wandra MannanZack Brooks, ChiropodistAssistant Director of CSW Department. Patient and daughters agreed to this arrangement and will plan to move patient to [facility] once a insurance auth is in place. Per MD- patient is medically stable for dc today.  Employment status:  Audiological scientistull-Time Insurance information:  Therapist, musicManaged Care (Commercial BCBS) PT Recommendations:  Skilled Nursing Facility Information / Referral to community resources:  Skilled Nursing Facility  Patient/Family's Response to care:  Patient and daughters are agreeable to current plan of d/c and were very accepting that this arrangement happened very quickly.  They are confident that patient will improve quickly and be able to return home soon and resume her independent status. Patient wants to eventually return to work.    Patient/Family's Understanding of and Emotional Response to Diagnosis, Current Treatment, and Prognosis:  Despite recent fall and need for SNF rehab- patient has shown remarkable ability to see the positives in her current situation. Patient and daughters are clearly aware of her need for rehab and fully support this plan.  They were very happy that [facility] is  willing to accept patient once insurance auth is in place as they are awarBerkley Harveye of this facility and it will be close to family as well.    Emotional Assessment Appearance:  Appears stated age (Patient is very active- still working at [employer]) Attitude/Demeanor/Rapport:  Other (Pleasant, invovled in her plan of care, asking good questions about her her rehab.) Affect (typically observed):  Happy, Calm, Pleasant,  Accepting Orientation:  Oriented to Self, Oriented to Place, Oriented to  Time, Oriented to Situation Alcohol / Substance use:  Never Used Psych involvement (Current and /or in the community):  No (Comment)  Discharge Needs  Concerns to be addressed:  Care Coordination (Needs short term SNF) Readmission within the last 30 days:  No Current discharge risk:  None Barriers to Discharge:  Insurance Authorization (Will seek Letter of Guarantee Bed until insurance auth obtained)   Lovette Clicherowder, [name] T, LCSW 07/09/2014, 1:35 pm

## 2014-07-09 NOTE — Progress Notes (Signed)
PT Cancellation Note  Patient Details Name: Deanna Diaz MRN: [id number] DOB: February 16, 1931   Cancelled Treatment:    Reason Eval/Treat Not Completed: Fatigue/lethargy limiting ability to participate.  Pt sound asleep and family request hold PT at this time.  Will f/u another time.     Lucy Woolever, Alison MurrayMegan F 07/09/2014, 11:21 AM

## 2014-07-09 NOTE — Progress Notes (Addendum)
Orders received for pt discharge.  Discharge summary printed and reviewed with pt.  Explained medication regimen, and pt had no further questions at this time.  IV removed and site remains clean, dry, intact.  Telemetry removed.  Pt in stable condition and awaiting transport [facility]. Report was called and given to [facility], at [phone], the receiving nurse at [facility].  She had no further questions.

## 2014-07-09 NOTE — Clinical Social Work Placement (Signed)
   CLINICAL SOCIAL WORK PLACEMENT  NOTE  Date:  07/09/2014  Patient Details  Name: Deanna Diaz MRN: [id number] Date of Birth: June 04, 1930  Clinical Social Work is seeking post-discharge placement for this patient at the Skilled  Nursing Facility level of care (*CSW will initial, date and re-position this form in  chart as items are completed):  Yes   Patient/family provided with [place] Clinical Social Work Department's list of facilities offering this level of care within the geographic area requested by the patient (or if unable, by the patient's family).  Yes   Patient/family informed of their freedom to choose among providers that offer the needed level of care, that participate in Medicare, Medicaid or managed care program needed by the patient, have an available bed and are willing to accept the patient.  Yes   Patient/family informed of [place]'s ownership interest in [facility] and [facility], as well as of the fact that they are under no obligation to receive care at these facilities.  PASRR submitted to EDS on 07/02/14     PASRR number received on 07/09/14     Existing PASRR number confirmed on       FL2 transmitted to all facilities in geographic area requested by pt/family on 07/09/14     FL2 transmitted to all facilities within larger geographic area on       Patient informed that his/her managed care company has contracts with or will negotiate with certain facilities, including the following:   (Has Commercial BCBS- requires prior auth for SNF)     Yes   Patient/family informed of bed offers received.  [facility] (short term then move to [facility])     Physician recommends and patient chooses bed at      [facility] on 07/09/14.  Patient to be transferred to facility by  Ambulance  [facility])     Patient family notified on 07/09/14 of  transfer.  Name of family member notified:  Daughters: Kennon RoundsSally and SomaliaSylvia     PHYSICIAN Please prepare priority discharge summary, including medications, Please sign FL2, Please prepare prescriptions     Additional Comment: Ok per MD for d/c to SNF today for short term rehab.  Patient and daughters are agreeable to d/c plan. Nursing called report to SNF and EMS arranged. No further CSW needs identified and CSW signed off.  Lorri Frederickonna T. Andria RheinCrowder, LCSW [phone]      _______________________________________________ Darylene Pricerowder, [name], LCSW 07/09/2014, 9:54 PM

## 2014-07-12 DIAGNOSIS — S7290XA Unspecified fracture of unspecified femur, initial encounter for closed fracture: Secondary | ICD-10-CM | POA: Diagnosis not present

## 2014-07-12 DIAGNOSIS — Z7401 Bed confinement status: Secondary | ICD-10-CM | POA: Diagnosis not present

## 2014-07-12 DIAGNOSIS — R270 Ataxia, unspecified: Secondary | ICD-10-CM | POA: Diagnosis not present

## 2014-07-12 DIAGNOSIS — S72002D Fracture of unspecified part of neck of left femur, subsequent encounter for closed fracture with routine healing: Secondary | ICD-10-CM | POA: Diagnosis not present

## 2014-07-12 DIAGNOSIS — E43 Unspecified severe protein-calorie malnutrition: Secondary | ICD-10-CM | POA: Diagnosis not present

## 2014-07-12 DIAGNOSIS — D649 Anemia, unspecified: Secondary | ICD-10-CM | POA: Diagnosis not present

## 2014-07-12 DIAGNOSIS — K5900 Constipation, unspecified: Secondary | ICD-10-CM | POA: Diagnosis not present

## 2014-07-12 DIAGNOSIS — M810 Age-related osteoporosis without current pathological fracture: Secondary | ICD-10-CM | POA: Diagnosis not present

## 2014-07-12 DIAGNOSIS — Z96642 Presence of left artificial hip joint: Secondary | ICD-10-CM | POA: Diagnosis not present

## 2014-07-12 DIAGNOSIS — I10 Essential (primary) hypertension: Secondary | ICD-10-CM | POA: Diagnosis not present

## 2014-07-12 DIAGNOSIS — R55 Syncope and collapse: Secondary | ICD-10-CM | POA: Diagnosis not present

## 2014-07-16 DIAGNOSIS — S72002D Fracture of unspecified part of neck of left femur, subsequent encounter for closed fracture with routine healing: Secondary | ICD-10-CM | POA: Diagnosis not present

## 2014-07-20 DIAGNOSIS — D649 Anemia, unspecified: Secondary | ICD-10-CM | POA: Diagnosis not present

## 2014-07-20 DIAGNOSIS — S7290XA Unspecified fracture of unspecified femur, initial encounter for closed fracture: Secondary | ICD-10-CM | POA: Diagnosis not present

## 2014-07-20 DIAGNOSIS — K5900 Constipation, unspecified: Secondary | ICD-10-CM | POA: Diagnosis not present

## 2014-07-20 DIAGNOSIS — R55 Syncope and collapse: Secondary | ICD-10-CM | POA: Diagnosis not present

## 2014-08-04 ENCOUNTER — Encounter: Payer: Self-pay | Admitting: Internal Medicine

## 2014-08-04 ENCOUNTER — Ambulatory Visit (INDEPENDENT_AMBULATORY_CARE_PROVIDER_SITE_OTHER): Payer: BLUE CROSS/BLUE SHIELD | Admitting: Internal Medicine

## 2014-08-04 VITALS — BP 140/70 | HR 80 | Ht 65.0 in | Wt 120.4 lb

## 2014-08-04 DIAGNOSIS — R55 Syncope and collapse: Secondary | ICD-10-CM | POA: Diagnosis not present

## 2014-08-04 NOTE — Progress Notes (Signed)
ELECTROPHYSIOLOGY CONSULT NOTE  Patient ID: Deanna Diaz, MRN: [id number], DOB/AGE: 1930/06/14 81 y.o. Admit date: (Not on file) Date of Consult: 08/04/2014  Primary Physician: Ailene RavelHAMRICK,MAURA L, MD Primary Cardiologist: *new**  Chief Complaint: syncope   HPI Deanna Diaz is a 81 y.o. female referred for evaluation of syncope. This resulted in a hip fracture.  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 She was standing without symptoms. Her manager noted her to fall abruptly. She was alert. Immediately upon landing.  She has no prior history of syncope. She has had no palpitations. She denies a prior history of strokes or heart disease. She does have hypertension.  Telemetry was reviewed from the hospital and demonstrated normal rhythm Echo 2016  Normal LV fnction with mild left atrial enlargement  Past Medical History  Diagnosis Date  . Asthma   . Irregular heart beat   . Hypertension   . Arthritis   . Syncope 07/06/2014      Surgical History:  Past Surgical History  Procedure Laterality Date  . Tumor excision  1950's    from breast   . Hip arthroplasty Left 07/06/2014    Procedure: ARTHROPLASTY BIPOLAR HIP (HEMIARTHROPLASTY);  Surgeon: Sheral Apleyimothy D Murphy, MD;  [facility];  Service: Orthopedics;  Laterality: Left;     Home Meds: Prior to Admission medications   Medication Sig Start Date End Date Taking? Authorizing Provider  acetaminophen (TYLENOL) 500 MG tablet Take 500 mg by mouth every 6 (six) hours as needed for mild pain or moderate pain.    Historical Provider, MD  alendronate (FOSAMAX) 70 MG tablet Take 70 mg by mouth every 7 (seven) days. thursdays    Historical Provider, MD  aspirin EC 325 MG tablet Take 1 tablet (325 mg total) by mouth daily. 07/06/14   Sheral Apleyimothy D Murphy, MD  docusate sodium (COLACE) 100 MG capsule Take 1 capsule (100 mg total) by mouth 2 (two) times daily. Continue this while taking narcotics to help with bowel  movements 07/06/14   Sheral Apleyimothy D Murphy, MD  escitalopram (LEXAPRO) 10 MG tablet Take 10 mg by mouth daily.    Historical Provider, MD  feeding supplement, ENSURE ENLIVE, (ENSURE ENLIVE) LIQD Take 237 mLs by mouth 2 (two) times daily between meals. 07/09/14   Penny Piarlando Vega, MD  HYDROcodone-acetaminophen (NORCO) 5-325 MG per tablet Take 1-2 tablets by mouth every 4 (four) hours as needed for moderate pain. 07/09/14   Brittney Tresa EndoKelly, PA-C  hydroxypropyl methylcellulose / hypromellose (ISOPTO TEARS / GONIOVISC) 2.5 % ophthalmic solution Place 1 drop into both eyes as needed for dry eyes.    Historical Provider, MD  metoprolol (LOPRESSOR) 25 MG tablet Take 0.5 tablets (12.5 mg total) by mouth 2 (two) times daily. 07/09/14   Penny Piarlando Vega, MD      Allergies:  Allergies  Allergen Reactions  . Dilaudid [Hydromorphone] Nausea And Vomiting  . Hydrocodone Nausea And Vomiting  . Oxycodone Nausea And Vomiting    History   Social History  . Marital Status: Married    Spouse Name: N/A  . Number of Children: N/A  . Years of Education: N/A   Occupational History  . Not on file.   Social History Main Topics  . Smoking status: Never Smoker   . Smokeless tobacco: Never Used  . Alcohol Use: No  . Drug Use: No  . Sexual Activity: Not on file   Other Topics Concern  . Not on file  Social History Narrative     Family History  Problem Relation Age of Onset  . Hypertension Mother   . Ulcers Father     stomach     ROS:  Please see the history of present illness.     All other systems reviewed and negative.    Physical Exam:   Blood pressure 140/70, pulse 80, height  (1.651 m), weight 120 lb 6.4 oz (54.613 kg). General: Well developed, well nourished female in no acute distress. Head: Normocephalic, atraumatic, sclera non-icteric, no xanthomas, nares are without discharge. EENT: normal Lymph Nodes:  none Back: with /kyphosis , no CVA tendersness Neck: Negative for carotid bruits. JVD  not elevated. Lungs: Clear bilaterally to auscultation without wheezes, rales, or rhonchi. Breathing is unlabored. Heart: RRR with S1 S2. 2/6 systolic murmur , rubs, or gallops appreciated. Abdomen: Soft, non-tender, non-distended with normoactive bowel sounds. No hepatomegaly. No rebound/guarding. No obvious abdominal masses. Msk:  Strength and tone appear normal for age. Extremities: No clubbing or cyanosis. No  edema.  Distal pedal pulses are 2+ and equal bilaterally. Skin: Warm and Dry Neuro: Alert and oriented X 3. CN III-XII intact Grossly normal sensory and motor function . Psych:  Responds to questions appropriately with a normal affect.      Labs: Cardiac Enzymes No results for input(s): CKTOTAL, CKMB, TROPONINI in the last 72 hours. CBC Lab Results  Component Value Date   WBC 14.9* 07/09/2014   HGB 9.5* 07/09/2014   HCT 29.2* 07/09/2014   MCV 87.7 07/09/2014   PLT 148* 07/09/2014   PROTIME: No results for input(s): LABPROT, INR in the last 72 hours. Chemistry No results for input(s): NA, K, CL, CO2, BUN, CREATININE, CALCIUM, PROT, BILITOT, ALKPHOS, ALT, AST, GLUCOSE in the last 168 hours.  Invalid input(s): LABALBU Lipids Lab Results  Component Value Date   CHOL  03/06/2007    181        ATP III CLASSIFICATION:  <200     mg/dL   Desirable  409-811  mg/dL   Borderline High  >=914    mg/dL   High          HDL 40 03/06/2007   LDLCALC * 03/06/2007    128        Total Cholesterol/HDL:CHD Risk Coronary Heart Disease Risk Table                     Men   Women  1/2 Average Risk   3.4   3.3  Average Risk       5.0   4.4  2 X Average Risk   9.6   7.1  3 X Average Risk  23.4   11.0        Use the calculated Patient Ratio above and the CHD Risk Table to determine the patient's CHD Risk.        ATP III CLASSIFICATION (LDL):  <100     mg/dL   Optimal  782-956  mg/dL   Near or Above                    Optimal  130-159  mg/dL   Borderline  213-086  mg/dL   High   >578     mg/dL   Very High   TRIG 64 46/96/2952   BNP PRO B NATRIURETIC PEPTIDE (BNP)  Date/Time Value Ref Range Status  03/06/2007 06:07 AM 456.0*  Final   Thyroid Function Tests: No results  for input(s): TSH, T4TOTAL, T3FREE, THYROIDAB in the last 72 hours.  Invalid input(s): FREET3    Miscellaneous Lab Results  Component Value Date   DDIMER  03/06/2007    0.37        AT THE INHOUSE ESTABLISHED CUTOFF VALUE OF 0.48 ug/mL FEU, THIS ASSAY HAS BEEN DOCUMENTED IN THE LITERATURE TO HAVE A SENSITIVITY AND NEGATIVE PREDICTIVE VALUE OF 100 PERCENT FOR DEEP VEIN THROMBOSIS AND PULMONARY EMBOLISM.    Radiology/Studies:  Dg Chest 1 View  07/06/2014   CLINICAL DATA:  Syncope, fell this morning at work, LEFT hip injury, history asthma, hypertension  EXAM: CHEST  1 VIEW  COMPARISON:  03/05/2007  FINDINGS: Mild enlargement of cardiac silhouette.  Atherosclerotic calcification aorta.  Mediastinal contours and pulmonary vascularity normal.  Mild bronchitic changes.  Skin fold projects over RIGHT upper lobe.  No definite infiltrate, pleural effusion or pneumothorax.  Osseous demineralization.  IMPRESSION: Mild enlargement of cardiac silhouette.  Bronchitic changes.   Electronically Signed   By: Ulyses Southward M.D.   On: 07/06/2014 08:47   Ct Head Wo Contrast  07/06/2014   CLINICAL DATA:  Became dizzy and fell, headache, history asthma, irregular heartbeat, hypertension  EXAM: CT HEAD WITHOUT CONTRAST  CT CERVICAL SPINE WITHOUT CONTRAST  TECHNIQUE: Multidetector CT imaging of the head and cervical spine was performed following the standard protocol without intravenous contrast. Multiplanar CT image reconstructions of the cervical spine were also generated.  COMPARISON:  None  FINDINGS: CT HEAD FINDINGS  Beam hardening artifacts of dental origin.  Normal ventricular morphology.  No midline shift or mass effect.  Mild small vessel chronic ischemic changes of deep cerebral white matter.  No intracranial  hemorrhage, mass lesion, or evidence acute infarction.  No extra-axial fluid collections.  Atherosclerotic calcifications at carotid siphons.  Bones demineralized.  Minimal fluid or mucus in RIGHT maxillary sinus with remaining sinuses and mastoid air cells clear.  CT CERVICAL SPINE FINDINGS  Prevertebral soft tissues normal thickness.  Bones demineralized with probable discogenic sclerosis at inferior C2.  Visualized skullbase intact.  Multilevel disc space narrowing and endplate spur formation throughout cervical spine.  Mild scattered facet degenerative changes.  Vertebral body heights maintained without fracture or subluxation.  Encroachment upon RIGHT C4-C5 and C5-C6 neural foramina by significant uncovertebral spurs.  Lung apices clear.  Atherosclerotic calcifications of the carotid systems bilaterally.  IMPRESSION: Atrophy with small vessel chronic ischemic changes of deep cerebral white matter.  No acute intracranial abnormalities.  Degenerative disc and facet disease changes of the cervical spine as described above.  No acute cervical spine abnormalities.   Electronically Signed   By: Ulyses Southward M.D.   On: 07/06/2014 10:12   Ct Cervical Spine Wo Contrast  07/06/2014   CLINICAL DATA:  Became dizzy and fell, headache, history asthma, irregular heartbeat, hypertension  EXAM: CT HEAD WITHOUT CONTRAST  CT CERVICAL SPINE WITHOUT CONTRAST  TECHNIQUE: Multidetector CT imaging of the head and cervical spine was performed following the standard protocol without intravenous contrast. Multiplanar CT image reconstructions of the cervical spine were also generated.  COMPARISON:  None  FINDINGS: CT HEAD FINDINGS  Beam hardening artifacts of dental origin.  Normal ventricular morphology.  No midline shift or mass effect.  Mild small vessel chronic ischemic changes of deep cerebral white matter.  No intracranial hemorrhage, mass lesion, or evidence acute infarction.  No extra-axial fluid collections.  Atherosclerotic  calcifications at carotid siphons.  Bones demineralized.  Minimal fluid or mucus in RIGHT maxillary  sinus with remaining sinuses and mastoid air cells clear.  CT CERVICAL SPINE FINDINGS  Prevertebral soft tissues normal thickness.  Bones demineralized with probable discogenic sclerosis at inferior C2.  Visualized skullbase intact.  Multilevel disc space narrowing and endplate spur formation throughout cervical spine.  Mild scattered facet degenerative changes.  Vertebral body heights maintained without fracture or subluxation.  Encroachment upon RIGHT C4-C5 and C5-C6 neural foramina by significant uncovertebral spurs.  Lung apices clear.  Atherosclerotic calcifications of the carotid systems bilaterally.  IMPRESSION: Atrophy with small vessel chronic ischemic changes of deep cerebral white matter.  No acute intracranial abnormalities.  Degenerative disc and facet disease changes of the cervical spine as described above.  No acute cervical spine abnormalities.   Electronically Signed   By: Ulyses Southward M.D.   On: 07/06/2014 10:12   Pelvis Portable  07/07/2014   CLINICAL DATA:  Postop left hip arthroplasty.  EXAM: PORTABLE PELVIS 1-2 VIEWS  COMPARISON:  None.  FINDINGS: Postoperative left hip hemiarthroplasty. Non cemented femoral component appears well seated. No evidence of dislocation. Soft tissue gas around the left hip and groin region is likely due to recent surgery. Pelvis and right hip appear grossly intact.  IMPRESSION: Postoperative left hip hemiarthroplasty. Components appear well seated. No dislocation or fracture. Soft tissue gas consistent with recent surgery   Electronically Signed   By: Burman Nieves M.D.   On: 07/07/2014 01:11   Dg Hip Unilat With Pelvis 2-3 Views Left  07/06/2014   CLINICAL DATA:  Larey Seat this morning at work, LEFT hip deformity, initial encounter  EXAM: LEFT HIP (WITH PELVIS) 2-3 VIEWS  COMPARISON:  None  FINDINGS: Diffuse osseous demineralization.  Displaced LEFT femoral neck  fracture.  No dislocation or additional fractures.  Hip joint spaces appear slightly narrowed bilaterally.  Mild sclerosis and irregularity of the SI joints bilaterally suggesting sacroiliitis.  Degenerative disc and facet disease changes at visualized lower lumbar spine.  IMPRESSION: Displaced LEFT femoral neck fracture.   Electronically Signed   By: Ulyses Southward M.D.   On: 07/06/2014 08:45    EKG:   Sinus rhythm with left bundle branch block 20/14/43   Assessment and Plan:   Syncope  Left bundle branch block  Hypertension  She has syncope in the context of left bundle branch block. The story of abrupt onset offset syncope is most consistent with transient intermittent complete heart block. This is more likely than a vasomotor episode given the abruptness of the event.  Based on the Press Trial it is certainly reasonable at this juncture to proceed with pacing given syncope bundle branch block no reversible cause. There is some possibility, based on data from the Issue Trial that this could be a vasomotor episode. I think this is less likely.  We have reviewed the above. The family will discuss it and get back with [place] as to whether they would like to proceed with an event recorder based on the  data from the Issue Trial or proceed with pacing based on the Press Trial  We will stop her aspirin.    Sherryl Manges

## 2014-08-04 NOTE — Patient Instructions (Addendum)
Medication Instructions:  Your physician has recommended you make the following change in your medication: 1) STOP Aspirin  Labwork: None ordered  Testing/Procedures: None ordered  Follow-Up: None at this time  Any Other Special Instructions Will Be Listed Below (If Applicable). Please call us with a decision on a event recorder or pacemaker.

## 2014-08-09 ENCOUNTER — Telehealth: Payer: Self-pay | Admitting: Internal Medicine

## 2014-08-09 DIAGNOSIS — Z01812 Encounter for preprocedural laboratory examination: Secondary | ICD-10-CM

## 2014-08-09 DIAGNOSIS — R55 Syncope and collapse: Secondary | ICD-10-CM

## 2014-08-09 NOTE — Telephone Encounter (Signed)
Pt has decided to go ahead with pacemaker placement--pls call to schedule-knows she is not here today (monday)

## 2014-08-12 NOTE — Telephone Encounter (Signed)
Informed patient's dtr (per pt ok), will call her back tomorrow/next week to arrange PPM procedure.  They are interested in going on July 15.

## 2014-08-16 NOTE — Telephone Encounter (Signed)
Follow up  Pt calling about alternate day for procedure: perhaps 7/14. Please call back and discuss.

## 2014-08-17 ENCOUNTER — Encounter: Payer: Self-pay | Admitting: *Deleted

## 2014-08-17 NOTE — Telephone Encounter (Signed)
They would like to remain for PPM insertion on 7/15.  Pre procedure labs on 7/08.  Wound check on 7/28. Letter of instructions reviewed with patient's dtr (per pt approval) and left at front desk for pick up. Patient's dtr verbalized understanding and agreeable to plan.

## 2014-08-18 DIAGNOSIS — I10 Essential (primary) hypertension: Secondary | ICD-10-CM | POA: Diagnosis not present

## 2014-08-18 DIAGNOSIS — Z9181 History of falling: Secondary | ICD-10-CM | POA: Diagnosis not present

## 2014-08-18 DIAGNOSIS — S72002A Fracture of unspecified part of neck of left femur, initial encounter for closed fracture: Secondary | ICD-10-CM | POA: Diagnosis not present

## 2014-08-18 DIAGNOSIS — I499 Cardiac arrhythmia, unspecified: Secondary | ICD-10-CM | POA: Diagnosis not present

## 2014-08-18 DIAGNOSIS — Z6820 Body mass index (BMI) 20.0-20.9, adult: Secondary | ICD-10-CM | POA: Diagnosis not present

## 2014-09-01 DIAGNOSIS — S72002D Fracture of unspecified part of neck of left femur, subsequent encounter for closed fracture with routine healing: Secondary | ICD-10-CM | POA: Diagnosis not present

## 2014-09-03 ENCOUNTER — Other Ambulatory Visit: Payer: BLUE CROSS/BLUE SHIELD

## 2014-09-03 NOTE — Telephone Encounter (Signed)
Spoke with patient's dtr (per her ok). Advised schedule change in hospital on 7/15.   Procedure moved to 09/17/14. Arrive at 7:30 a.m. Pre procedure labs rescheduled to 7/15.   Wound check rescheduled to 8/01. Dtr is aware I will call her with new procedure time.  Patient's dtr verbalized understanding and agreeable to plan.

## 2014-09-10 ENCOUNTER — Other Ambulatory Visit (INDEPENDENT_AMBULATORY_CARE_PROVIDER_SITE_OTHER): Payer: BLUE CROSS/BLUE SHIELD | Admitting: *Deleted

## 2014-09-10 DIAGNOSIS — R55 Syncope and collapse: Secondary | ICD-10-CM | POA: Diagnosis not present

## 2014-09-10 DIAGNOSIS — Z01812 Encounter for preprocedural laboratory examination: Secondary | ICD-10-CM | POA: Diagnosis not present

## 2014-09-10 LAB — CBC WITH DIFFERENTIAL/PLATELET
BASOS PCT: 0.4 % (ref 0.0–3.0)
Basophils Absolute: 0 10*3/uL (ref 0.0–0.1)
Eosinophils Absolute: 0.2 10*3/uL (ref 0.0–0.7)
Eosinophils Relative: 2.3 % (ref 0.0–5.0)
HEMATOCRIT: 36.2 % (ref 36.0–46.0)
HEMOGLOBIN: 11.9 g/dL — AB (ref 12.0–15.0)
LYMPHS ABS: 1.6 10*3/uL (ref 0.7–4.0)
LYMPHS PCT: 24.4 % (ref 12.0–46.0)
MCHC: 32.9 g/dL (ref 30.0–36.0)
MCV: 84.4 fl (ref 78.0–100.0)
Monocytes Absolute: 0.7 10*3/uL (ref 0.1–1.0)
Monocytes Relative: 11.4 % (ref 3.0–12.0)
NEUTROS ABS: 4 10*3/uL (ref 1.4–7.7)
Neutrophils Relative %: 61.5 % (ref 43.0–77.0)
Platelets: 228 10*3/uL (ref 150.0–400.0)
RBC: 4.29 Mil/uL (ref 3.87–5.11)
RDW: 13.3 % (ref 11.5–15.5)
WBC: 6.6 10*3/uL (ref 4.0–10.5)

## 2014-09-10 LAB — BASIC METABOLIC PANEL
BUN: 20 mg/dL (ref 6–23)
CO2: 31 meq/L (ref 19–32)
CREATININE: 0.92 mg/dL (ref 0.40–1.20)
Calcium: 10.3 mg/dL (ref 8.4–10.5)
Chloride: 103 mEq/L (ref 96–112)
GFR: 62.13 mL/min (ref >60.00)
Glucose, Bld: 85 mg/dL (ref 70–99)
Potassium: 4.4 mEq/L (ref 3.5–5.1)
SODIUM: 138 meq/L (ref 135–145)

## 2014-09-10 NOTE — Addendum Note (Signed)
Addended by: Tonita PhoenixBOWDEN, Bird Swetz K on: 09/10/2014 02:12 PM   Modules accepted: Orders

## 2014-09-13 ENCOUNTER — Encounter (HOSPITAL_COMMUNITY): Payer: Self-pay | Admitting: Pharmacy Technician

## 2014-09-17 ENCOUNTER — Ambulatory Visit (HOSPITAL_COMMUNITY)
Admission: RE | Admit: 2014-09-17 | Discharge: 2014-09-18 | Disposition: A | Discharge status: Home / Self Care | Payer: BLUE CROSS/BLUE SHIELD | Source: Ambulatory Visit | Attending: Internal Medicine | Admitting: Internal Medicine

## 2014-09-17 ENCOUNTER — Encounter (HOSPITAL_COMMUNITY): Payer: Self-pay | Admitting: Internal Medicine

## 2014-09-17 ENCOUNTER — Encounter (HOSPITAL_COMMUNITY)
Admission: RE | Disposition: A | Discharge status: Home / Self Care | Payer: Self-pay | Source: Ambulatory Visit | Attending: Internal Medicine

## 2014-09-17 DIAGNOSIS — I447 Left bundle-branch block, unspecified: Secondary | ICD-10-CM | POA: Diagnosis not present

## 2014-09-17 DIAGNOSIS — R55 Syncope and collapse: Secondary | ICD-10-CM | POA: Diagnosis not present

## 2014-09-17 DIAGNOSIS — I10 Essential (primary) hypertension: Secondary | ICD-10-CM | POA: Diagnosis present

## 2014-09-17 DIAGNOSIS — I442 Atrioventricular block, complete: Secondary | ICD-10-CM | POA: Diagnosis not present

## 2014-09-17 DIAGNOSIS — Z959 Presence of cardiac and vascular implant and graft, unspecified: Secondary | ICD-10-CM

## 2014-09-17 HISTORY — PX: EP IMPLANTABLE DEVICE: SHX172B

## 2014-09-17 HISTORY — DX: Left bundle-branch block, unspecified: I44.7

## 2014-09-17 LAB — SURGICAL PCR SCREEN
MRSA, PCR: NEGATIVE
Staphylococcus aureus: POSITIVE — AB

## 2014-09-17 SURGERY — PACEMAKER IMPLANT

## 2014-09-17 MED ORDER — ACETAMINOPHEN 325 MG PO TABS: 325.0000 mg | ORAL_TABLET | ORAL | Status: DC | PRN

## 2014-09-17 MED ORDER — CEFAZOLIN SODIUM-DEXTROSE 2-3 GM-% IV SOLR
2.0000 g | INTRAVENOUS | Status: AC
  Administered 2014-09-17: 2 g via INTRAVENOUS

## 2014-09-17 MED ORDER — SODIUM CHLORIDE 0.9 % IV SOLN: INTRAVENOUS | Status: AC

## 2014-09-17 MED ORDER — MUPIROCIN 2 % EX OINT
TOPICAL_OINTMENT | CUTANEOUS | Status: AC
  Administered 2014-09-17: 1 via TOPICAL
  Filled 2014-09-17: qty 22

## 2014-09-17 MED ORDER — ONDANSETRON HCL 4 MG/2ML IJ SOLN
INTRAMUSCULAR | Status: AC
  Filled 2014-09-17: qty 2

## 2014-09-17 MED ORDER — ONDANSETRON HCL 4 MG/2ML IJ SOLN
INTRAMUSCULAR | Status: DC | PRN
  Administered 2014-09-17: 4 mg via INTRAVENOUS

## 2014-09-17 MED ORDER — SODIUM CHLORIDE 0.9 % IR SOLN
Status: AC
  Filled 2014-09-17: qty 2

## 2014-09-17 MED ORDER — SODIUM CHLORIDE 0.9 % IV SOLN
INTRAVENOUS | Status: DC
  Administered 2014-09-17: 10:00:00 via INTRAVENOUS

## 2014-09-17 MED ORDER — LIDOCAINE HCL (PF) 1 % IJ SOLN
INTRAMUSCULAR | Status: DC | PRN
  Administered 2014-09-17: 31 mL via INTRADERMAL

## 2014-09-17 MED ORDER — LIDOCAINE HCL (PF) 1 % IJ SOLN
INTRAMUSCULAR | Status: AC
  Filled 2014-09-17: qty 30

## 2014-09-17 MED ORDER — CEFAZOLIN SODIUM 1-5 GM-% IV SOLN
1.0000 g | Freq: Four times a day (QID) | INTRAVENOUS | Status: AC
  Administered 2014-09-17 – 2014-09-18 (×3): 1 g via INTRAVENOUS
  Filled 2014-09-17 (×4): qty 50

## 2014-09-17 MED ORDER — ONDANSETRON HCL 4 MG/2ML IJ SOLN: 4.0000 mg | Freq: Four times a day (QID) | INTRAMUSCULAR | Status: DC | PRN

## 2014-09-17 MED ORDER — SODIUM CHLORIDE 0.9 % IR SOLN
80.0000 mg | Status: AC
  Administered 2014-09-17: 80 mg
  Filled 2014-09-17: qty 2

## 2014-09-17 MED ORDER — CHLORHEXIDINE GLUCONATE 4 % EX LIQD
60.0000 mL | Freq: Once | CUTANEOUS | Status: DC
  Filled 2014-09-17: qty 60

## 2014-09-17 MED ORDER — HEPARIN (PORCINE) IN NACL 2-0.9 UNIT/ML-% IJ SOLN
INTRAMUSCULAR | Status: DC | PRN
  Administered 2014-09-17: 13:00:00

## 2014-09-17 MED ORDER — FENTANYL CITRATE (PF) 100 MCG/2ML IJ SOLN
INTRAMUSCULAR | Status: AC
  Filled 2014-09-17: qty 2

## 2014-09-17 MED ORDER — MIDAZOLAM HCL 5 MG/5ML IJ SOLN
INTRAMUSCULAR | Status: AC
  Filled 2014-09-17: qty 5

## 2014-09-17 MED ORDER — MUPIROCIN 2 % EX OINT
1.0000 "application " | TOPICAL_OINTMENT | Freq: Once | CUTANEOUS | Status: AC
  Administered 2014-09-17: 1 via TOPICAL

## 2014-09-17 MED ORDER — MIDAZOLAM HCL 5 MG/5ML IJ SOLN
INTRAMUSCULAR | Status: DC | PRN
  Administered 2014-09-17: 1 mg via INTRAVENOUS

## 2014-09-17 MED ORDER — FENTANYL CITRATE (PF) 100 MCG/2ML IJ SOLN
INTRAMUSCULAR | Status: DC | PRN
  Administered 2014-09-17: 50 ug via INTRAVENOUS

## 2014-09-17 MED ORDER — HEPARIN (PORCINE) IN NACL 2-0.9 UNIT/ML-% IJ SOLN
INTRAMUSCULAR | Status: AC
  Filled 2014-09-17: qty 500

## 2014-09-17 SURGICAL SUPPLY — 7 items
CABLE SURGICAL S-101-97-12 (CABLE) ×3 IMPLANT
LEAD CAPSURE NOVUS 45CM (Lead) ×3 IMPLANT
LEAD CAPSURE NOVUS 5076-52CM (Lead) ×3 IMPLANT
PAD DEFIB LIFELINK (PAD) ×3 IMPLANT
PPM ADVISA MRI DR A2DR01 (Pacemaker) ×3 IMPLANT
SHEATH CLASSIC 7F (SHEATH) ×6 IMPLANT
TRAY PACEMAKER INSERTION (CUSTOM PROCEDURE TRAY) ×3 IMPLANT

## 2014-09-17 NOTE — H&P (Signed)
Patient Care Team: [name], MD as PCP - General (Family Medicine)   HPI  Deanna Diaz is a 81 y.o. female Admitted for hip fracture following syncope  She has LBBB and normal LV function   No interval syncoep  Past Medical History  Diagnosis Date  . Asthma   . Irregular heart beat   . Hypertension   . Arthritis   . Syncope 07/06/2014    Past Surgical History  Procedure Laterality Date  . Tumor excision  1950's    from breast   . Hip arthroplasty Left 07/06/2014    Procedure: ARTHROPLASTY BIPOLAR HIP (HEMIARTHROPLASTY);  Surgeon: Sheral Apley, MD;  Location: [facility] OR;  Service: Orthopedics;  Laterality: Left;    Current Facility-Administered Medications  Medication Dose Route Frequency Provider Last Rate Last Dose  . 0.9 %  sodium chloride infusion   Intravenous Continuous Marily Lente, NP 50 mL/hr at 09/17/14 [phone]    . 0.9 %  sodium chloride infusion   Intravenous Continuous Marily Lente, NP 50 mL/hr at 09/17/14 [phone]    . ceFAZolin (ANCEF) IVPB 2 g/50 mL premix  2 g Intravenous To Cath Amber Caryl Bis, NP      . chlorhexidine (HIBICLENS) 4 % liquid 4 application  60 mL Topical Once Amber Caryl Bis, NP      . gentamicin (GARAMYCIN) 80 mg in sodium chloride irrigation 0.9 % 500 mL irrigation  80 mg Irrigation To Cath Marily Lente, NP        Allergies  Allergen Reactions  . Dilaudid [Hydromorphone] Nausea And Vomiting  . Hydrocodone Nausea And Vomiting  . Oxycodone Nausea And Vomiting     Medication List    ASK your doctor about these medications        acetaminophen 500 MG tablet  Commonly known as:  TYLENOL  Take 500 mg by mouth every 6 (six) hours as needed for mild pain or moderate pain.     docusate sodium 100 MG capsule  Commonly known as:  COLACE  Take 100 mg by mouth daily as needed for mild constipation.     escitalopram 10 MG tablet  Commonly known as:  LEXAPRO  Take 10 mg by mouth daily.     feeding supplement (ENSURE ENLIVE)  Liqd  Take 237 mLs by mouth 2 (two) times daily between meals.     levofloxacin 500 MG tablet  Commonly known as:  LEVAQUIN  Take 500 mg by mouth daily.     metoprolol tartrate 25 MG tablet  Commonly known as:  LOPRESSOR  Take 0.5 tablets (12.5 mg total) by mouth 2 (two) times daily.         Review of Systems negative except from HPI and PMH  Physical Exam BP 157/61 mmHg  Pulse 70  Temp(Src) 98 F (36.7 C)  Resp 18  Ht  (1.626 m)  Wt 122 lb (55.339 kg)  BMI 20.93 kg/m2  SpO2 96% Well developed and well nourished in no acute distress HENT normal E scleral and icterus clear Neck Supple JVP flat; carotids brisk and full Clear to ausculation  Regular rate and rhythm, no murmurs gallops or rub Soft with active bowel sounds No clubbing cyanosis  Edema Alert and oriented, grossly normal motor and sensory function Skin Warm and Dry  ECG  Sinus with LBBB occ PVC  RVOT   Assessment and  Plan  LBBB  Syncope  HTN   For pacer  today  The benefits and risks were reviewed including but not limited to death,  perforation, infection, lead dislodgement and device malfunction.  The patient understands agrees and is willing to proceed.

## 2014-09-18 ENCOUNTER — Ambulatory Visit (HOSPITAL_COMMUNITY): Payer: BLUE CROSS/BLUE SHIELD

## 2014-09-18 ENCOUNTER — Encounter (HOSPITAL_COMMUNITY): Payer: Self-pay | Admitting: Nurse Practitioner

## 2014-09-18 DIAGNOSIS — I447 Left bundle-branch block, unspecified: Secondary | ICD-10-CM | POA: Diagnosis not present

## 2014-09-18 DIAGNOSIS — I442 Atrioventricular block, complete: Secondary | ICD-10-CM | POA: Diagnosis not present

## 2014-09-18 DIAGNOSIS — I10 Essential (primary) hypertension: Secondary | ICD-10-CM | POA: Diagnosis not present

## 2014-09-18 DIAGNOSIS — R55 Syncope and collapse: Secondary | ICD-10-CM | POA: Diagnosis not present

## 2014-09-18 NOTE — Discharge Summary (Signed)
  Discharge Summary   Patient ID: Deanna Diaz,  MRN: [id number], DOB/AGE: 1930/04/27 81 y.o.  Admit date: 09/17/2014 Discharge date: 09/18/2014  Primary Care Provider: [name] Primary Cardiologist: Odessa Fleming, MD   Discharge Diagnoses Principal Problem:   Syncope, cardiogenic  **Status Medtronic post dual-chamber permanent pacemaker placement this admission. Active Problems:   Essential hypertension   LBBB (left bundle branch block)   Allergies Allergies  Allergen Reactions  . Dilaudid [Hydromorphone] Nausea And Vomiting  . Hydrocodone Nausea And Vomiting  . Oxycodone Nausea And Vomiting    Procedures  Permanent pacemaker placement 7.22.2016  Medtronic MRI compatible  pulse generator serial number [id number]. _____________   History of Present Illness  81 year old female with a history of syncope and hip fracture in May 2016 in the setting of left bundle branch block. She has subsequent follow-up with electrophysiology and given the abrupt onset and offset of syncope with underlying left bundle branch block morphology, it was felt that transient intermittent complete heart block was the most likely cause of her syncope and thus permanent pacemaker placement was felt to be indicated.  Hospital Course  Patient presented to the [facility] electrophysiology laboratory on 09/17/2014 and underwent successful placement of a Medtronic MRI compatible dual-chamber permanent pacemaker. She tolerated the procedure well and postprocedure device interrogation and chest x-ray returned within normal limits. She will be discharged home today in good condition with device clinic follow-up within the next 10 days.  Discharge Vitals Blood pressure 139/56, pulse 74, temperature 99 F (37.2 C), temperature source Oral, resp. rate 13, height  (1.626 m), weight 123 lb (55.792 kg), SpO2 98 %.  Filed Weights   09/17/14 0705 09/18/14 0533  Weight: 122 lb (55.339 kg) 123 lb (55.792 kg)     Labs  None  Disposition  Pt is being discharged home today in good condition.  Follow-up Plans & Appointments  Follow-up Information    Follow up with [facility] On 09/27/2014.   Why:  4:30 PM - Device Clinic   Contact information:   [address] [place] [address] [phone]      Follow up with Sherryl Manges, MD In 3 months.   Specialty:  Cardiology   Contact information:   [address] [place] [address] [phone]       Discharge Medications    Medication List    TAKE these medications        acetaminophen 500 MG tablet  Commonly known as:  TYLENOL  Take 500 mg by mouth every 6 (six) hours as needed for mild pain or moderate pain.     docusate sodium 100 MG capsule  Commonly known as:  COLACE  Take 100 mg by mouth daily as needed for mild constipation.     escitalopram 10 MG tablet  Commonly known as:  LEXAPRO  Take 10 mg by mouth daily.     feeding supplement (ENSURE ENLIVE) Liqd  Take 237 mLs by mouth 2 (two) times daily between meals.        Outstanding Labs/Studies  None  Duration of Discharge Encounter   Greater than 30 minutes including physician time.  Signed, Nicolasa Ducking NP 09/18/2014, 10:35 AM    EP Attending  Patient seen and examined. Agree with above.  Leonia Reeves.D.

## 2014-09-18 NOTE — Discharge Instructions (Signed)
**  PLEASE REMEMBER TO BRING ALL OF YOUR MEDICATIONS TO EACH OF YOUR FOLLOW-UP OFFICE VISITS.     Supplemental Discharge Instructions for  Pacemaker/Defibrillator Patients  Activity No heavy lifting or vigorous activity with your left/right arm for 6 to 8 weeks.  Do not raise your left/right arm above your head for one week.  Gradually raise your affected arm as drawn below.           __     7/28               /     7/29             /   7/30            /       7/31            NO DRIVING for     ; you may begin driving on  1/61   .  WOUND CARE - Keep the wound area clean and dry.  Do not get this area wet for one week. No showers for one week; you may shower on  7/31   . - The tape/steri-strips on your wound will fall off; do not pull them off.  No bandage is needed on the site.  DO  NOT apply any creams, oils, or ointments to the wound area. - If you notice any drainage or discharge from the wound, any swelling or bruising at the site, or you develop a fever > 101? F after you are discharged home, call the office at once.  Special Instructions - You are still able to use cellular telephones; use the ear opposite the side where you have your pacemaker/defibrillator.  Avoid carrying your cellular phone near your device. - When traveling through airports, show security personnel your identification card to avoid being screened in the metal detectors.  Ask the security personnel to use the hand wand. - Avoid arc welding equipment, MRI testing (magnetic resonance imaging), TENS units (transcutaneous nerve stimulators).  Call the office for questions about other devices. - Avoid electrical appliances that are in poor condition or are not properly grounded. - Microwave ovens are safe to be near or to operate.

## 2014-09-18 NOTE — Progress Notes (Signed)
Patient ID: Deanna Diaz, female   DOB: 15-Dec-1930, 81 y.o.   MRN: [id number]    SUBJECTIVE: The patient is doing well today.  At this time, she denies chest pain, shortness of breath, or any new concerns.       OBJECTIVE: Physical Exam: Filed Vitals:   09/17/14 2111 09/17/14 2141 09/18/14 0533 09/18/14 0550  BP: 115/44 125/52  139/56  Pulse: 80 82  74  Temp:   99 F (37.2 C)   TempSrc:   Oral   Resp: Height:      Weight:   123 lb (55.792 kg)   SpO2: 92% 94%  98%    Intake/Output Summary (Last 24 hours) at 09/18/14 0919 Last data filed at 09/18/14 0800  Gross per 24 hour  Intake    320 ml  Output      0 ml  Net    320 ml    Telemetry reveals sinus rhythm  GEN- The patient is well appearing, alert and oriented x 3 today.   Neck- supple, no JVP Lungs- Clear to ausculation bilaterally, normal work of breathing, incision with no hematoma. Heart- Regular rate and rhythm, no murmurs, rubs or gallops, PMI not laterally displaced GI- soft, NT, ND, + BS Extremities- no clubbing, cyanosis, or edema Skin- no rash or lesion Psych- euthymic mood, full affect Neuro- strength and sensation are intact  LABS: Basic Metabolic Panel: No results for input(s): NA, K, CL, CO2, GLUCOSE, BUN, CREATININE, CALCIUM, MG, PHOS in the last 72 hours. Liver Function Tests: No results for input(s): AST, ALT, ALKPHOS, BILITOT, PROT, ALBUMIN in the last 72 hours. No results for input(s): LIPASE, AMYLASE in the last 72 hours. CBC: No results for input(s): WBC, NEUTROABS, HGB, HCT, MCV, PLT in the last 72 hours. Cardiac Enzymes: No results for input(s): CKTOTAL, CKMB, CKMBINDEX, TROPONINI in the last 72 hours. BNP: Invalid input(s): POCBNP D-Dimer: No results for input(s): DDIMER in the last 72 hours. Hemoglobin A1C: No results for input(s): HGBA1C in the last 72 hours. Fasting Lipid Panel: No results for input(s): CHOL, HDL, LDLCALC, TRIG, CHOLHDL, LDLDIRECT in the last 72  hours. Thyroid Function Tests: No results for input(s): TSH, T4TOTAL, T3FREE, THYROIDAB in the last 72 hours.  Invalid input(s): FREET3 Anemia Panel: No results for input(s): VITAMINB12, FOLATE, FERRITIN, TIBC, IRON, RETICCTPCT in the last 72 hours.  RADIOLOGY: Dg Chest 2 View  09/18/2014   CLINICAL DATA:  Status post pacing device placement  EXAM: CHEST - 2 VIEW  COMPARISON:  07/06/2014  FINDINGS: No pneumothorax is noted following pacemaker placement. The cardiac shadow is within normal limits. The lungs are mildly hyperinflated without focal infiltrate. No acute bony abnormality is noted.  IMPRESSION: No acute abnormality following pacemaker placement.   Electronically Signed   By: Alcide Clever M.D.   On: 09/18/2014 08:13    ASSESSMENT AND PLAN:  Active Problems:   Syncope, cardiogenic s/p PPM Rec: ok for discharge home. Her PPM has been interrogated and working normally. CXR is ok. Usual followup. No change in meds.    Lewayne Bunting, MD 09/18/2014 9:19 AM

## 2014-09-21 DIAGNOSIS — M810 Age-related osteoporosis without current pathological fracture: Secondary | ICD-10-CM | POA: Diagnosis not present

## 2014-09-21 DIAGNOSIS — Z6820 Body mass index (BMI) 20.0-20.9, adult: Secondary | ICD-10-CM | POA: Diagnosis not present

## 2014-09-21 DIAGNOSIS — Z139 Encounter for screening, unspecified: Secondary | ICD-10-CM | POA: Diagnosis not present

## 2014-09-23 ENCOUNTER — Ambulatory Visit: Payer: BLUE CROSS/BLUE SHIELD

## 2014-09-23 DIAGNOSIS — Z780 Asymptomatic menopausal state: Secondary | ICD-10-CM | POA: Diagnosis not present

## 2014-09-23 DIAGNOSIS — Z13820 Encounter for screening for osteoporosis: Secondary | ICD-10-CM | POA: Diagnosis not present

## 2014-09-27 ENCOUNTER — Ambulatory Visit (INDEPENDENT_AMBULATORY_CARE_PROVIDER_SITE_OTHER): Payer: BLUE CROSS/BLUE SHIELD | Admitting: *Deleted

## 2014-09-27 DIAGNOSIS — I447 Left bundle-branch block, unspecified: Secondary | ICD-10-CM

## 2014-09-27 DIAGNOSIS — Z950 Presence of cardiac pacemaker: Secondary | ICD-10-CM

## 2014-09-27 LAB — CUP PACEART INCLINIC DEVICE CHECK
Battery Voltage: 3.14 V
Brady Statistic AP VP Percent: 0.01 %
Brady Statistic AP VS Percent: 2.93 %
Brady Statistic RA Percent Paced: 2.95 %
Date Time Interrogation Session: 20160801172050
Lead Channel Impedance Value: 437 Ohm
Lead Channel Impedance Value: 494 Ohm
Lead Channel Impedance Value: 513 Ohm
Lead Channel Pacing Threshold Amplitude: 0.5 V
Lead Channel Pacing Threshold Pulse Width: 0.4 ms
Lead Channel Sensing Intrinsic Amplitude: 10.875 mV
Lead Channel Sensing Intrinsic Amplitude: 2.125 mV
Lead Channel Sensing Intrinsic Amplitude: 4.5 mV
Lead Channel Setting Pacing Amplitude: 3.5 V
Lead Channel Setting Pacing Amplitude: 3.5 V
MDC IDC MSMT LEADCHNL RA IMPEDANCE VALUE: 418 Ohm
MDC IDC MSMT LEADCHNL RA PACING THRESHOLD PULSEWIDTH: 0.4 ms
MDC IDC MSMT LEADCHNL RV PACING THRESHOLD AMPLITUDE: 1 V
MDC IDC MSMT LEADCHNL RV SENSING INTR AMPL: 9.25 mV
MDC IDC SET LEADCHNL RV PACING PULSEWIDTH: 0.4 ms
MDC IDC SET LEADCHNL RV SENSING SENSITIVITY: 2 mV
MDC IDC SET ZONE DETECTION INTERVAL: 400 ms
MDC IDC STAT BRADY AS VP PERCENT: 0.04 %
MDC IDC STAT BRADY AS VS PERCENT: 97.02 %
MDC IDC STAT BRADY RV PERCENT PACED: 0.05 %
Zone Setting Detection Interval: 400 ms

## 2014-09-27 NOTE — Progress Notes (Signed)
Wound check appointment. Steri-strips removed. Wound without redness or edema. Incision edges approximated, wound well healed. Normal device function. Thresholds, sensing, and impedances consistent with implant measurements. Device programmed at 3.5V/auto capture programmed on for extra safety margin until 3 month visit. Histogram distribution appropriate for patient and level of activity. No mode switches or high ventricular rates noted. Patient educated about wound care, arm mobility, lifting restrictions. ROV w/ SK 12/30/14.

## 2014-10-07 ENCOUNTER — Encounter: Payer: Self-pay | Admitting: *Deleted

## 2014-10-07 ENCOUNTER — Telehealth: Payer: Self-pay | Admitting: Internal Medicine

## 2014-10-07 NOTE — Telephone Encounter (Signed)
New message      Patient needs a note to return to work. She recently had a pacemaker put in.  Patient works in a Physicist, medical.  Please fax note to [phone]

## 2014-10-07 NOTE — Telephone Encounter (Signed)
Spoke with patient's dtr (ok per pt).  Reminded of mom's lifting restrictions, but cleared to return to work. Note faxed to requested fax number.

## 2014-11-16 ENCOUNTER — Encounter: Payer: Self-pay | Admitting: Internal Medicine

## 2014-12-16 ENCOUNTER — Encounter: Payer: Self-pay | Admitting: Internal Medicine

## 2014-12-16 ENCOUNTER — Telehealth: Payer: Self-pay | Admitting: Internal Medicine

## 2014-12-16 NOTE — Telephone Encounter (Signed)
New Message      Pt's daughter calling stating that pt has gone back to work and she keeps getting dizzy and falling at work. Pt has seen her PCP already and they can't find anything wrong. Please call back and advise.

## 2014-12-16 NOTE — Telephone Encounter (Signed)
Daughter, Ms. Chancy HurterJester, will send a remote today after work. Ms. Chancy HurterJester also aware pt has an OV w/ Dr. Graciela HusbandsKlein 12/30/14.

## 2014-12-16 NOTE — Telephone Encounter (Signed)
Pt's daughter called because pt has gone back to work and she gets dizzy and also almost faint at work. Pt had a double chamber pacemaker inplant on 09/18/14. Daughter said that pt has not have the pacemaker checked since coming for incision check. Pt and daughter are aware that I will let the device clinic know about this issue.

## 2014-12-17 NOTE — Telephone Encounter (Signed)
LMOVM w/ direct # to device clinic.   Re: remote received

## 2014-12-17 NOTE — Telephone Encounter (Signed)
Ms. Deanna Diaz states pt fainted on 12/16/14 while at work. 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 After standing approx 1hr, she will pass out. On 12/16/14, her supervisor saw her swaying, ran over to her work station, then caught her just prior to floor impact. Remote was received. Device does not show any episodes on 12/16/14. There was 1 NSVT episode (27 beats) on 11/02/14, also ~4hr PAF on 11/08/14, no mention of symptoms in Sept. Pt will not return to work until seeing Dr. Graciela HusbandsKlein 12/30/14. Ms. Deanna Diaz will continue monitor her mother. She will send add'l remotes if more episodes occur.

## 2014-12-30 ENCOUNTER — Encounter: Payer: Self-pay | Admitting: *Deleted

## 2014-12-30 ENCOUNTER — Encounter: Payer: Self-pay | Admitting: Internal Medicine

## 2014-12-30 ENCOUNTER — Ambulatory Visit (INDEPENDENT_AMBULATORY_CARE_PROVIDER_SITE_OTHER): Payer: BLUE CROSS/BLUE SHIELD | Admitting: Internal Medicine

## 2014-12-30 VITALS — BP 138/68 | HR 70 | Ht >= 80 in | Wt 108.4 lb

## 2014-12-30 DIAGNOSIS — I447 Left bundle-branch block, unspecified: Secondary | ICD-10-CM

## 2014-12-30 DIAGNOSIS — R55 Syncope and collapse: Secondary | ICD-10-CM

## 2014-12-30 LAB — CUP PACEART INCLINIC DEVICE CHECK
Battery Remaining Longevity: 140 mo
Battery Voltage: 3.07 V
Date Time Interrogation Session: 20161103164116
Implantable Lead Implant Date: 20160722
Implantable Lead Location: 753860
Implantable Lead Model: 5076
Lead Channel Impedance Value: 380 Ohm
Lead Channel Impedance Value: 437 Ohm
Lead Channel Impedance Value: 551 Ohm
Lead Channel Pacing Threshold Amplitude: 1 V
Lead Channel Pacing Threshold Pulse Width: 0.4 ms
Lead Channel Sensing Intrinsic Amplitude: 3.375 mV
Lead Channel Setting Pacing Amplitude: 2 V
Lead Channel Setting Pacing Pulse Width: 0.4 ms
MDC IDC LEAD IMPLANT DT: 20160722
MDC IDC LEAD LOCATION: 753859
MDC IDC MSMT LEADCHNL RA PACING THRESHOLD AMPLITUDE: 0.5 V
MDC IDC MSMT LEADCHNL RA PACING THRESHOLD PULSEWIDTH: 0.4 ms
MDC IDC MSMT LEADCHNL RV IMPEDANCE VALUE: 456 Ohm
MDC IDC MSMT LEADCHNL RV SENSING INTR AMPL: 11.625 mV
MDC IDC SET LEADCHNL RA PACING AMPLITUDE: 1.5 V
MDC IDC SET LEADCHNL RV SENSING SENSITIVITY: 2 mV
MDC IDC STAT BRADY AP VP PERCENT: 0.04 %
MDC IDC STAT BRADY AP VS PERCENT: 5.76 %
MDC IDC STAT BRADY AS VP PERCENT: 0.44 %
MDC IDC STAT BRADY AS VS PERCENT: 93.76 %
MDC IDC STAT BRADY RA PERCENT PACED: 5.8 %
MDC IDC STAT BRADY RV PERCENT PACED: 0.48 %

## 2014-12-30 MED ORDER — MIDODRINE HCL 5 MG PO TABS: ORAL_TABLET | ORAL | Status: DC

## 2014-12-30 NOTE — Progress Notes (Signed)
      Patient Care Team: Maura L Hamrick, MD as PCP - General (Family Medicine)   HPI  Deanna Diaz is a 81 y.o. female Seen in follow-up forAilene Diaz pacemaker implanted for syncope with left bundle branch block. 7/16.  She had fallen and broken her hip while at work standing.  She had a recent presyncopal episode. She notes that she is dizzyupon standing for more than 30-45 minutes which she doesn't work routinely on a four-hour shift.  Records and Results Reviewed Echocardiogram demonstrated normal left ventricular function  Past Medical History  Diagnosis Date  . Asthma   . Irregular heart beat   . Hypertension   . Arthritis   . Syncope 07/06/2014    a. 08/2014 s/p MDT MRI compatible DC PPM, ser # [id number] H.  . LBBB (left bundle branch block)     Past Surgical History  Procedure Laterality Date  . Tumor excision  1950's    from breast   . Hip arthroplasty Left 07/06/2014    Procedure: ARTHROPLASTY BIPOLAR HIP (HEMIARTHROPLASTY);  Surgeon: Sheral Apleyimothy D Murphy, MD;  [facility];  Service: Orthopedics;  Laterality: Left;  . Ep implantable device N/A 09/17/2014    Procedure: Pacemaker Implant;  Surgeon: Duke SalviaSteven C Klein, MD;  [facility];  Service: Cardiovascular;  Laterality: N/A;    Current Outpatient Prescriptions  Medication Sig Dispense Refill  . alendronate (FOSAMAX) 70 MG tablet Take 70 mg by mouth once a week. Take with a full glass of water on an empty stomach.    Marland Kitchen. lisinopril-hydrochlorothiazide (PRINZIDE,ZESTORETIC) 20-25 MG tablet Take 1 tablet by mouth daily. for high blood pressure  5  . metoprolol (LOPRESSOR) 50 MG tablet Take 25 mg by mouth 2 (two) times daily.  3   No current facility-administered medications for this visit.    Allergies  Allergen Reactions  . Dilaudid [Hydromorphone] Nausea And Vomiting  . Hydrocodone Nausea And Vomiting  . Oxycodone Nausea And Vomiting      Review of Systems negative except from HPI and PMH  Physical  Exam BP 138/68 mmHg  Pulse 70  Ht 7' (2.134 m)  Wt 108 lb 6.4 oz (49.17 kg)  BMI 10.80 kg/m2 Well developed and well nourished in no acute distress HENT normal E scleral and icterus clear Neck Supple JVP flat; carotids brisk and full Clear to ausculation status post Regular rate and rhythm, no murmurs gallops or rub Soft with active bowel sounds No clubbing cyanosis  Edema Alert and oriented, grossly normal motor and sensory function Skin Warm and Dry  ECG demonstrates sinus rhythm at 70 with a only 18/12/39 Left bundle block with Rare PVC  Assessment and  Plan  Left bundle branch block  Syncope-arrhythmic versus vasomotor  Pacemaker-Medtronic The patient's device was interrogated and the information was fully reviewed.  The device was reprogrammed to maximize longevity  Atrial fibrillation-paroxysmal  The events of presyncope and dizziness with prolonged standing suggest orthostatic hypotension syndrome history of hypertension. We have discussed various strategies including the use of abdominal binder and pre-shift ProAmatine to try to mitigate the symptoms.  In the event that she has no further dizziness, I think it is reasonable for her to be working. Otherwise, I am concerned about the risk of fall of  She has atrial fibrillation; these episodes are infrequent and of not long duration And as such I don't think they weren't anticoagulation at this time (less than 12 hours)

## 2014-12-30 NOTE — Patient Instructions (Addendum)
Medication Instructions: 1) Start Proamitine 5 mg take one tablet daily 30 minutes prior to going to work.  Labwork: - none  Procedures/Testing: - none  Follow-Up: - Remote monitoring is used to monitor your Pacemaker of ICD from home. This monitoring reduces the number of office visits required to check your device to one time per year. It allows us to keep an eye on the functioning of your device to ensure it is working properly. You are scheduled for a device check from home on 03/30/14. You may send your transmission at any time that day. If you have a wireless device, the transmission will be sent automatically. After your physician reviews your transmission, you will receive a postcard with your next transmission date.  - Your physician wants you to follow-up in: 9 months with Dr. Graciela HusbandsKlein. You will receive a reminder letter in the mail two months in advance. If you don't receive a letter, please call our office to schedule the follow-up appointment.  Any Additional Special Instructions Will Be Listed Below (If Applicable). - Obtain an abdominal binder

## 2015-01-03 NOTE — Addendum Note (Signed)
Addended by: Reesa ChewJONES, Tahesha Skeet G on: 01/03/2015 05:05 PM   Modules accepted: Orders

## 2015-01-06 ENCOUNTER — Telehealth: Payer: Self-pay | Admitting: Cardiology

## 2015-01-06 ENCOUNTER — Encounter: Payer: Self-pay | Admitting: Internal Medicine

## 2015-01-06 NOTE — Telephone Encounter (Signed)
Pt daughter called and stated that pt had a dizzy spell. Transmission received

## 2015-01-06 NOTE — Telephone Encounter (Signed)
Spoke w/ Deanna Diaz. Pt felt presyncopal while at home. Pt was not at work. 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 Deanna Diaz states potential suggestion is to wait longer to attempt to return to work instead of going back next week to see if symptoms continue outside of work. I let her know I cannot prescribe advice but I agreed w/ her suggestion.   Remote also shows AF episodes. Last note states watchful waiting for potential OAC. Episodes printed for review. Deanna Diaz aware we will only call if medicinal changes are warranted.

## 2015-02-07 ENCOUNTER — Encounter: Payer: Self-pay | Admitting: Internal Medicine

## 2015-03-31 ENCOUNTER — Telehealth: Payer: Self-pay | Admitting: Cardiology

## 2015-03-31 ENCOUNTER — Ambulatory Visit: Payer: BLUE CROSS/BLUE SHIELD | Admitting: *Deleted

## 2015-03-31 NOTE — Telephone Encounter (Signed)
Spoke with pt and reminded pt of remote transmission that is due today. Pt verbalized understanding.   

## 2015-04-04 ENCOUNTER — Encounter: Payer: Self-pay | Admitting: Cardiology

## 2015-05-04 ENCOUNTER — Telehealth: Payer: Self-pay | Admitting: Internal Medicine

## 2015-05-04 NOTE — Telephone Encounter (Signed)
New message ° ° ° ° ° °1. Has your device fired? no °2. Is you device beeping? no ° °3. Are you experiencing draining or swelling at device site? no ° °4. Are you calling to see if we received your device transmission?yes °5. Have you passed out? no °

## 2015-05-04 NOTE — Telephone Encounter (Signed)
Spoke w/ pt daughter and informed her that we did receive pt remote transmission today. Pt daughter verbalized understanding.

## 2015-07-01 NOTE — Progress Notes (Signed)
Opened in error

## 2015-07-19 DIAGNOSIS — J069 Acute upper respiratory infection, unspecified: Secondary | ICD-10-CM | POA: Diagnosis not present

## 2015-08-22 DIAGNOSIS — N183 Chronic kidney disease, stage 3 (moderate): Secondary | ICD-10-CM | POA: Diagnosis not present

## 2015-08-22 DIAGNOSIS — E213 Hyperparathyroidism, unspecified: Secondary | ICD-10-CM | POA: Diagnosis not present

## 2015-08-22 DIAGNOSIS — Z9181 History of falling: Secondary | ICD-10-CM | POA: Diagnosis not present

## 2015-08-22 DIAGNOSIS — M810 Age-related osteoporosis without current pathological fracture: Secondary | ICD-10-CM | POA: Diagnosis not present

## 2015-08-22 DIAGNOSIS — R636 Underweight: Secondary | ICD-10-CM | POA: Diagnosis not present

## 2015-08-22 DIAGNOSIS — I10 Essential (primary) hypertension: Secondary | ICD-10-CM | POA: Diagnosis not present

## 2017-09-12 ENCOUNTER — Encounter: Payer: Self-pay | Admitting: Cardiology

## 2017-09-13 ENCOUNTER — Telehealth: Payer: Self-pay | Admitting: Licensed Clinical Social Worker

## 2017-09-13 DIAGNOSIS — F489 Nonpsychotic mental disorder, unspecified: Secondary | ICD-10-CM

## 2017-09-13 DIAGNOSIS — Z0389 Encounter for observation for other suspected diseases and conditions ruled out: Principal | ICD-10-CM

## 2017-09-13 NOTE — BH Specialist Note (Signed)
Client is noted as deceased. [facility] will make note to remove from follow up list.  

## 2017-09-20 DIAGNOSIS — Z1331 Encounter for screening for depression: Secondary | ICD-10-CM | POA: Diagnosis not present

## 2017-09-20 DIAGNOSIS — Z1339 Encounter for screening examination for other mental health and behavioral disorders: Secondary | ICD-10-CM | POA: Diagnosis not present

## 2017-09-20 DIAGNOSIS — Z139 Encounter for screening, unspecified: Secondary | ICD-10-CM | POA: Diagnosis not present

## 2017-09-20 DIAGNOSIS — Z9181 History of falling: Secondary | ICD-10-CM | POA: Diagnosis not present

## 2017-09-20 DIAGNOSIS — Z0000 Encounter for general adult medical examination without abnormal findings: Secondary | ICD-10-CM | POA: Diagnosis not present

## 2017-11-18 DIAGNOSIS — B0239 Other herpes zoster eye disease: Secondary | ICD-10-CM | POA: Diagnosis not present

## 2017-11-21 DIAGNOSIS — B0239 Other herpes zoster eye disease: Secondary | ICD-10-CM | POA: Diagnosis not present

## 2017-11-28 DIAGNOSIS — B0239 Other herpes zoster eye disease: Secondary | ICD-10-CM | POA: Diagnosis not present

## 2017-12-06 ENCOUNTER — Encounter: Payer: BLUE CROSS/BLUE SHIELD | Admitting: Internal Medicine

## 2017-12-16 DIAGNOSIS — B0239 Other herpes zoster eye disease: Secondary | ICD-10-CM | POA: Diagnosis not present

## 2018-01-13 ENCOUNTER — Encounter: Payer: BLUE CROSS/BLUE SHIELD | Admitting: Internal Medicine

## 2018-03-19 ENCOUNTER — Encounter: Payer: BLUE CROSS/BLUE SHIELD | Admitting: Internal Medicine

## 2018-04-28 ENCOUNTER — Encounter: Payer: BLUE CROSS/BLUE SHIELD | Admitting: Internal Medicine

## 2018-06-18 ENCOUNTER — Telehealth (INDEPENDENT_AMBULATORY_CARE_PROVIDER_SITE_OTHER): Payer: Medicare Other | Admitting: Internal Medicine

## 2018-06-18 ENCOUNTER — Telehealth: Payer: Self-pay | Admitting: *Deleted

## 2018-06-18 VITALS — Ht 62.0 in | Wt 110.0 lb

## 2018-06-18 DIAGNOSIS — I447 Left bundle-branch block, unspecified: Secondary | ICD-10-CM

## 2018-06-18 DIAGNOSIS — I10 Essential (primary) hypertension: Secondary | ICD-10-CM | POA: Diagnosis not present

## 2018-06-18 DIAGNOSIS — Z950 Presence of cardiac pacemaker: Secondary | ICD-10-CM

## 2018-06-18 DIAGNOSIS — R55 Syncope and collapse: Secondary | ICD-10-CM

## 2018-06-18 NOTE — Telephone Encounter (Signed)
Dr. Graciela Husbands requested that I contact patient's daughter to assist with setting up her Carelink monitor ([id number]). Pt's last transmission was in 04/2015.  LMOVM requesting call back to DC, gave direct number.

## 2018-06-18 NOTE — Progress Notes (Signed)
Electrophysiology TeleHealth Note   Due to national recommendations of social distancing due to COVID 19, an audio/video telehealth visit is felt to be most appropriate for this patient at this time.  See MyChart message from today for the patient's consent to telehealth for [facility].   Date:  06/18/2018   ID:  Deanna Diaz, DOB 1930-04-24, MRN [id number]  Location: patient's home  Provider location: [address] [place]  Evaluation Performed: Follow-up visit  PCP:  Hamrick, Durward Fortes, MD  Cardiologist:   none Electrophysiologist:  SK   Chief Complaint:     History of Present Illness:    Deanna Diaz is a 85 y.o. female who presents via audio/video conferencing for a telehealth visit today.  Since last being seen in our clinic in 2016 for syncope with pacemaker implantation  reports *scant complaints.  Mild dyspnea when she walks up and down the driveway which she does regularly for exercise.  No chest pain.  No edema.  No interval syncope.  Has not seen cardiology since 2016.  Has been followed by PCP recently relocated.    The patient denies symptoms of fevers, chills, cough, or new SOB worrisome for COVID 19.  *  Past Medical History:  Diagnosis Date  . Arthritis   . Asthma   . Hypertension   . Irregular heart beat   . LBBB (left bundle branch block)   . Syncope 07/06/2014   a. 08/2014 s/p MDT MRI compatible DC PPM, ser # [id number].    Past Surgical History:  Procedure Laterality Date  . EP IMPLANTABLE DEVICE N/A 09/17/2014   Procedure: Pacemaker Implant;  Surgeon: Duke Salvia, MD;  Location: [facility] INVASIVE CV LAB;  Service: Cardiovascular;  Laterality: N/A;  . HIP ARTHROPLASTY Left 07/06/2014   Procedure: ARTHROPLASTY BIPOLAR HIP (HEMIARTHROPLASTY);  Surgeon: Sheral Apley, MD;  Location: [facility] OR;  Service: Orthopedics;  Laterality: Left;  . TUMOR EXCISION  1950's   from breast     Current Outpatient Medications  Medication Sig Dispense  Refill  . alendronate (FOSAMAX) 70 MG tablet Take 70 mg by mouth once a week. Take with a full glass of water on an empty stomach.    Marland Kitchen FLUoxetine (PROZAC) 10 MG capsule Take 10 mg by mouth daily.    Marland Kitchen lisinopril-hydrochlorothiazide (PRINZIDE,ZESTORETIC) 20-25 MG tablet Take 1 tablet by mouth daily. for high blood pressure  5  . metoprolol (LOPRESSOR) 50 MG tablet Take 25 mg by mouth 2 (two) times daily.  3   No current facility-administered medications for this visit.     Allergies:   Dilaudid [hydromorphone]; Hydrocodone; and Oxycodone   Social History:  The patient  reports that she has never smoked. She has never used smokeless tobacco. She reports that she does not drink alcohol or use drugs.   Family History:  The patient's   family history includes Hypertension in her mother; Ulcers in her father.   ROS:  Please see the history of present illness.   All other systems are personally reviewed and negative.    Exam:    Vital Signs:  Ht 5\' 2"  (1.575 m)   Wt 110 lb (49.9 kg)   BMI 20.12 kg/m     Well appearing, alert and conversant, regular work of breathing,  good skin color Eyes- anicteric, neuro- grossly intact, skin- no apparent rash or lesions or cyanosis, mouth- oral mucosa is pink HOH  Labs/Other Tests and Data Reviewed:  Recent Labs: No results found for requested labs within last 8760 hours.   Wt Readings from Last 3 Encounters:  06/18/18 110 lb (49.9 kg)  12/30/14 108 lb 6.4 oz (49.2 kg)  09/18/14 123 lb (55.8 kg)     Other studies personally reviewed:   Last device interrogation 2016  ASSESSMENT & PLAN:    Left bundle branch block  Syncope  Pacemaker-[employer]  Atrial fibrillation  We have no interval information regarding device function or atrial fibrillation burden.  We will arrange reestablishment of remote follow-up and then post COVID will have her come in for full evaluation.  She lives in [place].  It may be easier for them in [place]      COVID 19 screen The patient denies symptoms of COVID 19 at this time.  The importance of social distancing was discussed today.  Follow-up:  2 months     Current medicines are reviewed at length with the patient today.   The patient does not have concerns regarding her medicines.  The following changes were made today:  none  Labs/ tests ordered today include:   No orders of the defined types were placed in this encounter.   Future tests ( post COVID )     Patient Risk:  after full review of this patients clinical status, I feel that they are at moderate  risk at this time.  Today, I have spent 6 minutes with the patient with telehealth technology discussing the above.  Signed, Sherryl MangesSteven Alyannah Sanks, MD  06/18/2018 4:40 PM     [facility] [address] [place] [address] [phone] (office) [phone] (fax)

## 2018-06-19 ENCOUNTER — Ambulatory Visit (INDEPENDENT_AMBULATORY_CARE_PROVIDER_SITE_OTHER): Payer: Medicare Other | Admitting: *Deleted

## 2018-06-19 DIAGNOSIS — I447 Left bundle-branch block, unspecified: Secondary | ICD-10-CM

## 2018-06-19 DIAGNOSIS — R55 Syncope and collapse: Secondary | ICD-10-CM

## 2018-06-19 LAB — CUP PACEART REMOTE DEVICE CHECK
Battery Remaining Longevity: 84 mo
Battery Voltage: 3.01 V
Brady Statistic AP VP Percent: 0.01 %
Brady Statistic AP VS Percent: 7.49 %
Brady Statistic AS VP Percent: 0.05 %
Brady Statistic AS VS Percent: 92.45 %
Brady Statistic RA Percent Paced: 7.41 %
Brady Statistic RV Percent Paced: 0.06 %
Date Time Interrogation Session: 20200423162154
Implantable Lead Implant Date: 20160722
Implantable Lead Implant Date: 20160722
Implantable Lead Location: 753859
Implantable Lead Location: 753860
Implantable Lead Model: 5076
Implantable Lead Model: 5076
Implantable Pulse Generator Implant Date: 20160722
Lead Channel Impedance Value: 342 Ohm
Lead Channel Impedance Value: 418 Ohm
Lead Channel Impedance Value: 456 Ohm
Lead Channel Impedance Value: 494 Ohm
Lead Channel Pacing Threshold Amplitude: 0.75 V
Lead Channel Pacing Threshold Amplitude: 0.875 V
Lead Channel Pacing Threshold Pulse Width: 0.4 ms
Lead Channel Pacing Threshold Pulse Width: 0.4 ms
Lead Channel Sensing Intrinsic Amplitude: 11.125 mV
Lead Channel Sensing Intrinsic Amplitude: 3.25 mV
Lead Channel Setting Pacing Amplitude: 1.5 V
Lead Channel Setting Pacing Amplitude: 2 V
Lead Channel Setting Pacing Pulse Width: 0.4 ms
Lead Channel Setting Sensing Sensitivity: 2 mV

## 2018-06-19 NOTE — Telephone Encounter (Signed)
Left message that transmission was received  Gypsy Balsam, NP 06/19/2018 1:55 PM

## 2018-06-19 NOTE — Telephone Encounter (Signed)
I help the pt daughter send the transmission successfully. I told her the nurse will look at the transmission and give her a call back.

## 2018-06-19 NOTE — Telephone Encounter (Signed)
Spoke w/ pt daughter and attempted to help her trouble shoot home monitor. After unsuccessful attempts. I instructed pt daughter to call tech support for further assistance. Error code 3230 in the middle of the transmission with an orange screen.

## 2018-06-27 ENCOUNTER — Other Ambulatory Visit: Payer: Self-pay

## 2018-06-27 NOTE — Progress Notes (Signed)
Remote pacemaker transmission.   

## 2018-07-14 ENCOUNTER — Encounter

## 2018-07-14 ENCOUNTER — Encounter: Payer: BLUE CROSS/BLUE SHIELD | Admitting: Internal Medicine

## 2018-07-28 ENCOUNTER — Telehealth: Payer: Self-pay

## 2018-07-28 DIAGNOSIS — I479 Paroxysmal tachycardia, unspecified: Secondary | ICD-10-CM

## 2018-07-28 NOTE — Telephone Encounter (Signed)
-----   Message from Duke Salvia, MD sent at 07/21/2018 10:06 AM EDT ----- Remote reviewed. This remote is abnormal for recurrent HR > 120 which would be exceedingly unusual in an 85 yo  L  Could we do things plz 1) arrange for CBC and TSH 2) see if we can get BP so we know whether it would be feasible to increase her lopressor  Thanks sk

## 2018-07-28 NOTE — Telephone Encounter (Signed)
Pt's daughter agrees to have labs drawn.  Her most recent BP is 151/83 taken on 6/1.

## 2018-07-29 NOTE — Telephone Encounter (Signed)
Noted  

## 2018-08-05 ENCOUNTER — Other Ambulatory Visit: Payer: Medicare Other

## 2018-09-18 ENCOUNTER — Encounter: Payer: BLUE CROSS/BLUE SHIELD | Admitting: *Deleted

## 2018-09-22 ENCOUNTER — Telehealth: Payer: Self-pay

## 2018-09-22 NOTE — Telephone Encounter (Signed)
Spoke with patient to remind of missed remote transmission 

## 2020-04-26 DEATH — deceased
